# Patient Record
Sex: Male | Born: 1990 | Hispanic: Yes | Marital: Single | Smoking: Never smoker
Health system: Southern US, Community
[De-identification: ages and names within clinical notes are randomized; demographics above are authoritative.]

## PROBLEM LIST (undated history)

## (undated) DIAGNOSIS — J45909 Unspecified asthma, uncomplicated: Secondary | ICD-10-CM

## (undated) DIAGNOSIS — R011 Cardiac murmur, unspecified: Secondary | ICD-10-CM

## (undated) HISTORY — DX: Unspecified asthma, uncomplicated: J45.909

## (undated) HISTORY — DX: Cardiac murmur, unspecified: R01.1

## (undated) HISTORY — PX: NO PAST SURGERIES: SHX2092

---
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
## 2013-02-18 ENCOUNTER — Emergency Department: Payer: Self-pay | Admitting: Emergency Medicine

## 2013-02-18 LAB — CBC
HCT: 43.9 % (ref 40.0–52.0)
HGB: 14.2 g/dL (ref 13.0–18.0)
MCH: 26.4 pg (ref 26.0–34.0)
MCHC: 32.3 g/dL (ref 32.0–36.0)
MCV: 82 fL (ref 80–100)
Platelet: 171 10*3/uL (ref 150–440)
RBC: 5.37 10*6/uL (ref 4.40–5.90)
WBC: 5.2 10*3/uL (ref 3.8–10.6)

## 2013-02-18 LAB — COMPREHENSIVE METABOLIC PANEL
Anion Gap: 4 — ABNORMAL LOW (ref 7–16)
BUN: 8 mg/dL (ref 7–18)
Creatinine: 0.96 mg/dL (ref 0.60–1.30)
EGFR (African American): >60
Osmolality: 281 (ref 275–301)
Potassium: 4.1 mmol/L (ref 3.5–5.1)
SGPT (ALT): 41 U/L (ref 12–78)
Sodium: 142 mmol/L (ref 136–145)
Total Protein: 7.2 g/dL (ref 6.4–8.2)

## 2014-12-16 IMAGING — CR DG CHEST 2V
1 series · 2 of 2 positions shown · non-contrast
Comparison: none

REASON FOR EXAM: cp
COMMENTS:

PROCEDURE:     DXR - DXR CHEST PA (OR AP) AND LATERAL  - February 18, 2013  [DATE]
RESULT:     Comparison: 07/18/2005

[Series 1: w chest pa · 0.14mm/px · 2 of 2 slices shown]
[im 1/2]
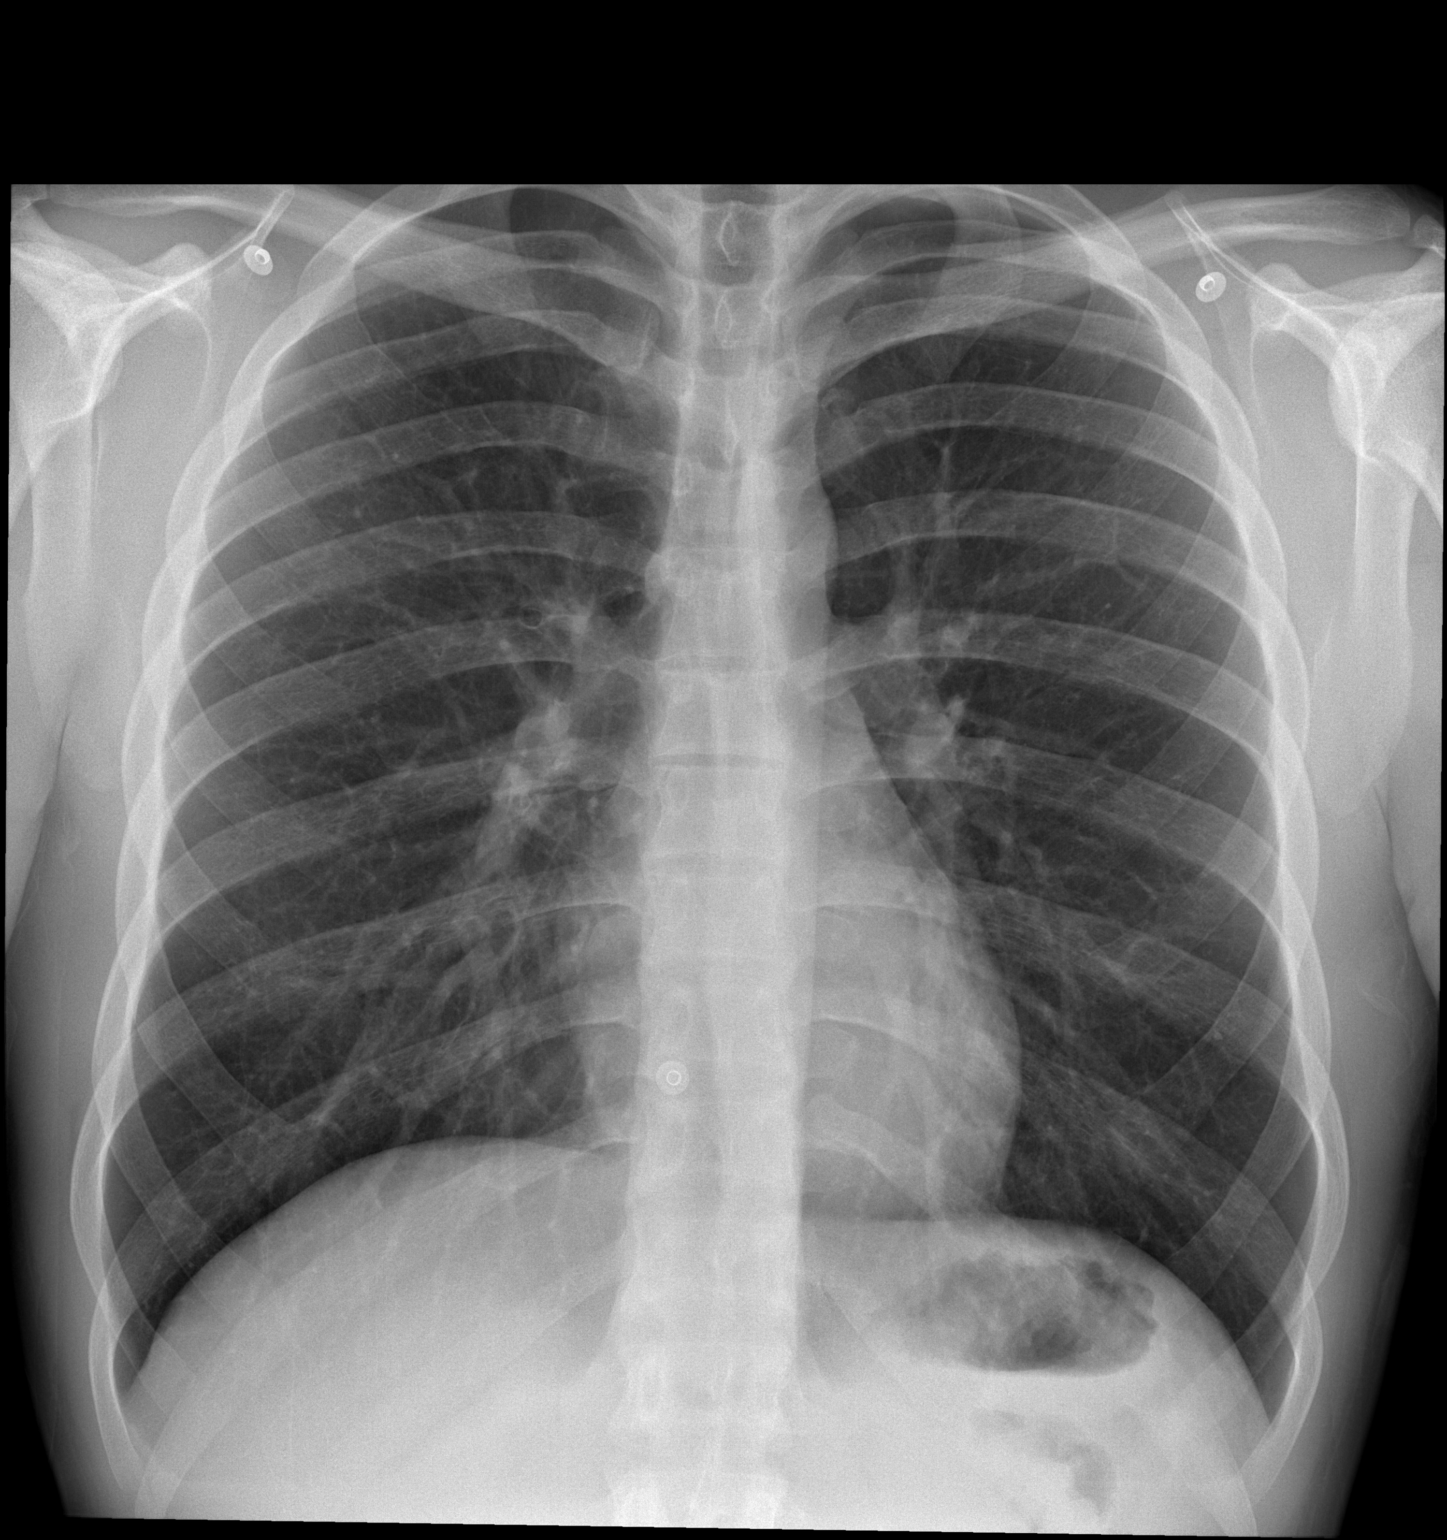
[im 2/2]
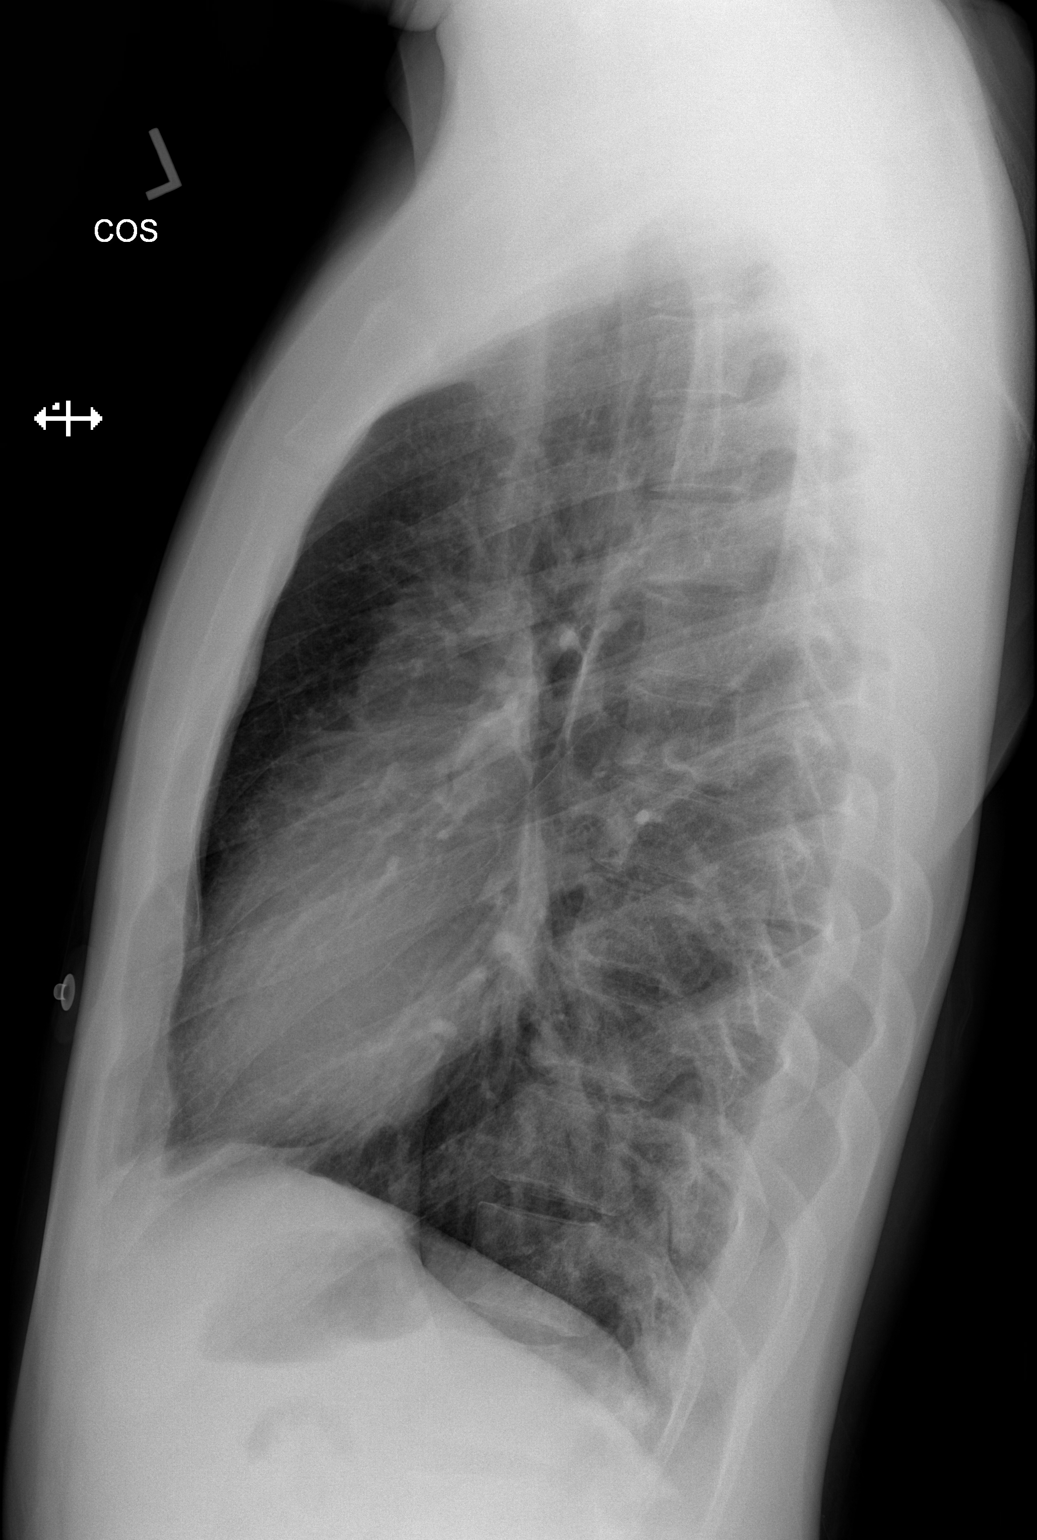

[2 of 2 positions shown; findings below may reference images not displayed]

FINDINGS: The heart and mediastinum are within normal limits. No focal pulmonary
opacities.
IMPRESSION: No acute cardiopulmonary disease.

[REDACTED]

## 2016-05-17 DIAGNOSIS — K219 Gastro-esophageal reflux disease without esophagitis: Secondary | ICD-10-CM | POA: Insufficient documentation

## 2016-08-20 DIAGNOSIS — R7303 Prediabetes: Secondary | ICD-10-CM | POA: Insufficient documentation

## 2018-09-15 ENCOUNTER — Encounter: Payer: Self-pay | Admitting: Family Medicine

## 2018-09-15 ENCOUNTER — Ambulatory Visit (INDEPENDENT_AMBULATORY_CARE_PROVIDER_SITE_OTHER): Payer: BLUE CROSS/BLUE SHIELD | Admitting: Family Medicine

## 2018-09-15 ENCOUNTER — Other Ambulatory Visit: Payer: Self-pay

## 2018-09-15 ENCOUNTER — Other Ambulatory Visit (HOSPITAL_COMMUNITY)
Admission: RE | Admit: 2018-09-15 | Discharge: 2018-09-15 | Disposition: A | Discharge status: Home / Self Care | Payer: BLUE CROSS/BLUE SHIELD | Source: Ambulatory Visit | Attending: Family Medicine | Admitting: Family Medicine

## 2018-09-15 VITALS — BP 110/88 | HR 62 | Temp 98.1°F | Resp 16 | Ht 67.0 in | Wt 146.7 lb

## 2018-09-15 DIAGNOSIS — J301 Allergic rhinitis due to pollen: Secondary | ICD-10-CM

## 2018-09-15 DIAGNOSIS — Z113 Encounter for screening for infections with a predominantly sexual mode of transmission: Secondary | ICD-10-CM | POA: Insufficient documentation

## 2018-09-15 DIAGNOSIS — Z1159 Encounter for screening for other viral diseases: Secondary | ICD-10-CM

## 2018-09-15 DIAGNOSIS — K219 Gastro-esophageal reflux disease without esophagitis: Secondary | ICD-10-CM

## 2018-09-15 DIAGNOSIS — G4700 Insomnia, unspecified: Secondary | ICD-10-CM

## 2018-09-15 DIAGNOSIS — Z0000 Encounter for general adult medical examination without abnormal findings: Secondary | ICD-10-CM | POA: Diagnosis not present

## 2018-09-15 DIAGNOSIS — E7800 Pure hypercholesterolemia, unspecified: Secondary | ICD-10-CM

## 2018-09-15 DIAGNOSIS — R7303 Prediabetes: Secondary | ICD-10-CM

## 2018-09-15 MED ORDER — LEVOCETIRIZINE DIHYDROCHLORIDE 5 MG PO TABS: 5.0000 mg | ORAL_TABLET | Freq: Every evening | ORAL | 1 refills | Status: DC

## 2018-09-15 MED ORDER — FAMOTIDINE 40 MG PO TABS: 40.0000 mg | ORAL_TABLET | Freq: Every day | ORAL | 1 refills | Status: DC

## 2018-09-15 NOTE — Patient Instructions (Addendum)
Try Melatonin - take 2.5-5mg  about 45 minutes before bed.   To help with reflux: - Elevate your head of bed - either with a few extra pillows, a wedge pillow, or by placing two bed risers under the head of bed posts. - Avoid the following foods: citrus, fatty foods, chocolate, peppermint, and excessive alcohol, along with sodas, orange juice (acidic drinks) - Stop eating at least 3 hours before going to bed, minimize naps/laying down after eating. - No smoking. - If you are overweight or obese, exercising and losing weight will also help your symptoms. - Caution: prolonged use of proton pump inhibitors like omeprazole (Prilosec), pantoprazole (Protonix), esomeprazole (Nexium), and others like Dexilant and Aciphex may increase your risk of pneumonia, Clostridium difficile colitis, osteoporosis, anemia and other health complications  Sleep Hygiene Tips 1) Get regular. One of the best ways to train your body to sleep well is to go to bed and get up at more or less the same time every day, even on weekends and days off! This regular rhythm will make you feel better and will give your body something to work from. 2) Sleep when sleepy. Only try to sleep when you actually feel tired or sleepy, rather than spending too much time awake in bed. 3) Get up & try again. If you haven't been able to get to sleep after about 20 minutes or more, get up and do something calming or boring until you feel sleepy, then return to bed and try again. Sit quietly on the couch with the lights off (bright light will tell your brain that it is time to wake up), or read something boring like the phone book. Avoid doing anything that is too stimulating or interesting, as this will wake you up even more. 4) Avoid caffeine & nicotine. It is best to avoid consuming any caffeine (in coffee, tea, cola drinks, chocolate, and some medications) or nicotine (cigarettes) for at least 4-6 hours before going to bed. These  substances act as stimulants and interfere with the ability to fall asleep 5) Avoid alcohol. It is also best to avoid alcohol for at least 4-6 hours before going to bed. Many people believe that alcohol is relaxing and helps them to get to sleep at first, but it actually interrupts the quality of sleep. 6) Bed is for sleeping. Try not to use your bed for anything other than sleeping and sex, so that your body comes to associate bed with sleep. If you use bed as a place to watch TV, eat, read, work on your laptop, pay bills, and other things, your body will not learn this Connection. 7) No naps. It is best to avoid taking naps during the day, to make sure that you are tired at bedtime. If you can't make it through the day without a nap, make sure it is for less than an hour and before 3pm. 8) Sleep rituals. You can develop your own rituals of things to remind your body that it is time to sleep - some people find it useful to do relaxing stretches or breathing exercises for 15 minutes before bed each night, or sit calmly with a cup of caffeine-free tea. 9) Bathtime. Having a hot bath 1-2 hours before bedtime can be useful, as it will raise your body temperature, causing you to feel sleepy as your body temperature drops again. Research shows that sleepiness is associated with a drop in body temperature. 10) No clock-watching. Many people who struggle with sleep tend  to watch the clock too much. Frequently checking the clock during the night can wake you up (especially if you turn on the light to read the time) and reinforces negative thoughts such as "Oh no, look how late it is, I'll never get to sleep" or "it's so early, I have only slept for 5 hours, this is terrible." 11) Use a sleep diary. This worksheet can be a useful way of making sure you have the right facts about your sleep, rather than making assumptions. Because a diary involves watching the clock (see point 10) it is  a good idea to only use it for two weeks to get an idea of what is going and then perhaps two months down the track to see how you are progressing. 12) Exercise. Regular exercise is a good idea to help with good sleep, but try not to do strenuous exercise in the 4 hours before bedtime. Morning walks are a great way to start the day feeling refreshed! 13) Eat right. A healthy, balanced diet will help you to sleep well, but timing is important. Some people find that a very empty stomach at bedtime is distracting, so it can be useful to have a light snack, but a heavy meal soon before bed can also interrupt sleep. Some people recommend a warm glass of milk, which contains tryptophan, which acts as a natural sleep inducer. 14) The right space. It is very important that your bed and bedroom are quiet and comfortable for sleeping. A cooler room with enough blankets to stay warm is best, and make sure you have curtains or an eyemask to block out early morning light and earplugs if there is noise outside your room. 15) Keep daytime routine the same. Even if you have a bad night sleep and are tired it is important that you try to keep your daytime activities the same as you had planned. That is, don't avoid activities because you feel tired. This can reinforce the insomnia.

## 2018-09-15 NOTE — Progress Notes (Signed)
Name: Mike Peters   MRN: [id number]    DOB: 1991/02/25   Date:09/15/2018       Progress Note  Subjective  Chief Complaint  Chief Complaint  Patient presents with  . Establish Care    HPI  Patient presents for annual CPE and to establish care.  USPSTF grade A and B recommendations:  Diet: Says he "eats everything".  Does not drink sodas, but does eat fried foods/fat food about 4 times a week. Exercise: Does not exercise.   Depression: He notes waking around 3-4am with some racing thoughts most days. He does note GERD - says it bothers him mostly at night and when he is hungry.  He was on protonix in the past but only for a week before stopping. We will trial pepcid today, and consider hydroxyzine or other sleep aid in the future. Denies chest pain, abdominal pain, no dysphagia. Depression screen [facility] 2/9 09/15/2018  Decreased Interest 0  Down, Depressed, Hopeless 0  PHQ - 2 Score 0  Altered sleeping 0  Tired, decreased energy 0  Change in appetite 0  Feeling bad or failure about yourself  0  Trouble concentrating 0  Moving slowly or fidgety/restless 0  Suicidal thoughts 0  PHQ-9 Score 0  Difficult doing work/chores Not difficult at all    Hypertension:  BP Readings from Last 3 Encounters:  09/15/18 110/88   Obesity: Wt Readings from Last 3 Encounters:  09/15/18 146 lb 11.2 oz (66.5 kg)   BMI Readings from Last 3 Encounters:  09/15/18 22.98 kg/m    Lipids: We will check today Glucose: We will check today Glucose  Date Value Ref Range Status  02/18/2013 89 65 - 99 mg/dL Final     [facility] Visit from 09/15/2018 in [facility]  AUDIT-C Score  0     Single STD testing and prevention (HIV/chl/gon/syphilis): 1 male partner in the last year; We will check today Hep C: We will check today  Skin cancer: No concerning lesions Colorectal cancer: Denies family or personal history of colorectal cancer, no changes in BM's - no blood in stool, dark and  tarry stool, mucus in stool, or constipation/diarrhea. Prostate cancer/Testicular cancer: Discussed self testicular examinations and signs and symptoms of prostate cancer.  No family history of prostate cancer.  No results found for: PSA  Lung cancer: N/A Low Dose CT Chest recommended if Age 74-80 years, 30 pack-year currently smoking OR have quit w/in 15years. Patient does not qualify.   AAA: N/A The USPSTF recommends one-time screening with ultrasonography in men ages 19 to 22 years who have ever smoked ECG:  History of heart murmur as child; no chest pain, shortness of breath, palpitations.   Advanced Care Planning: A voluntary discussion about advance care planning including the explanation and discussion of advance directives.  Discussed health care proxy and Living will, and the patient was able to identify a health care proxy as Kerri Bhatti.  Patient does not have a living will at present time. If patient does have living will, I have requested they bring this to the clinic to be scanned in to their chart.  Patient Active Problem List   Diagnosis Date Noted  . Prediabetes 08/20/2016  . GERD without esophagitis 05/17/2016    Past Surgical History:  Procedure Laterality Date  . NO PAST SURGERIES      Family History  Problem Relation Age of Onset  . Diabetes Mother        prediabetes  Social History   Socioeconomic History  . Marital status: Single    Spouse name: Not on file  . Number of children: Not on file  . Years of education: Not on file  . Highest education level: Not on file  Occupational History  . Not on file  Social Needs  . Financial resource strain: Not hard at all  . Food insecurity:    Worry: Never true    Inability: Never true  . Transportation needs:    Medical: No    Non-medical: No  Tobacco Use  . Smoking status: Never Smoker  . Smokeless tobacco: Never Used  Substance and Sexual Activity  . Alcohol use: Never    Frequency: Never  . Drug  use: Never  . Sexual activity: Yes    Partners: Female    Birth control/protection: Condom  Lifestyle  . Physical activity:    Days per week: 0 days    Minutes per session: 0 min  . Stress: Not at all  Relationships  . Social connections:    Talks on phone: More than three times a week    Gets together: More than three times a week    Attends religious service: 1 to 4 times per year    Active member of club or organization: No    Attends meetings of clubs or organizations: Never    Relationship status: Never married  . Intimate partner violence:    Fear of current or ex partner: No    Emotionally abused: No    Physically abused: No    Forced sexual activity: No  Other Topics Concern  . 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  levocetirizine (XYZAL) 5 MG tablet, Take 5 mg by mouth every evening., Disp: , Rfl:   No Known Allergies   ROS  Constitutional: Negative for fever or weight change.  Respiratory: Negative for cough and shortness of breath.   Cardiovascular: Negative for chest pain or palpitations.  Gastrointestinal: Negative for abdominal pain, no bowel changes.  Musculoskeletal: Negative for gait problem or joint swelling.  Skin: Negative for rash.  Neurological: Negative for dizziness or headache.  No other specific complaints in a complete review of systems (except as listed in HPI above).   Objective  Vitals:   09/15/18 0811  BP: 110/88  Pulse: 62  Resp: 16  Temp: 98.1 F (36.7 C)  TempSrc: Oral  SpO2: 93%  Weight: 146 lb 11.2 oz (66.5 kg)  Height: 5\' 7"  (1.702 m)    Body mass index is 22.98 kg/m.  Physical Exam Constitutional: Patient appears well-developed and well-nourished. No distress.  HENT: Head: Normocephalic and atraumatic. Ears: B TMs ok, no erythema or effusion; Nose: Nose normal. Mouth/Throat: Oropharynx is clear and moist. No  oropharyngeal exudate.  Eyes: Conjunctivae and EOM are normal. Pupils are equal, round, and reactive to light. No scleral icterus.  Neck: Normal range of motion. Neck supple. No JVD present. No thyromegaly present.  Cardiovascular: Normal rate, regular rhythm and normal heart sounds.  No murmur heard. No BLE edema. Pulmonary/Chest: Effort normal and breath sounds normal. No respiratory distress. Abdominal: Soft. Bowel sounds are normal, no distension. There is no tenderness. no masses MALE GENITALIA: Deferred RECTAL: Deferred Musculoskeletal: Normal range of motion, no joint effusions. No gross deformities Neurological: he is alert and oriented to person, place, and time.  No cranial nerve deficit. Coordination, balance, strength, speech and gait are normal.  Skin: Skin is warm and dry. No rash noted. No erythema.  Psychiatric: Patient has a normal mood and affect. behavior is normal. Judgment and thought content normal.  No results found for this or any previous visit (from the past 2160 hour(s)).  PHQ2/9: [facility] 09/15/2018  Decreased Interest 0  Down, Depressed, Hopeless 0  PHQ - 2 Score 0  Altered sleeping 0  Tired, decreased energy 0  Change in appetite 0  Feeling bad or failure about yourself  0  Trouble concentrating 0  Moving slowly or fidgety/restless 0  Suicidal thoughts 0  PHQ-9 Score 0  Difficult doing work/chores Not difficult at all   Fall Risk: Fall Risk  09/15/2018  Falls in the past year? 0  Number falls in past yr: 0  Injury with Fall? 0  Follow up Falls evaluation completed   Assessment & Plan  1. Annual physical exam - Prostate cancer screening and PSA options (with potential risks and benefits of testing vs not testing) were discussed along with recent recs/guidelines. -USPSTF grade A and B recommendations reviewed with patient; age-appropriate recommendations, preventive care, screening tests, etc discussed and encouraged; healthy living  encouraged; see AVS for patient education given to patient -Discussed importance of 150 minutes of physical activity weekly, eat two servings of fish weekly, eat one serving of tree nuts ( cashews, pistachios, pecans, almonds.Marland Kitchen.) every other day, eat 6 servings of fruit/vegetables daily and drink plenty of water and avoid sweet beverages.   2. Seasonal allergic rhinitis due to pollen - levocetirizine (XYZAL) 5 MG tablet; Take 1 tablet (5 mg total) by mouth every evening.  Dispense: 90 tablet; Refill: 1  3. Elevated LDL cholesterol level - Lipid panel  4. Insomnia, unspecified type - Melatonin  5. Prediabetes - Hemoglobin A1c - COMPLETE METABOLIC PANEL WITH GFR  6. GERD without esophagitis - famotidine (PEPCID) 40 MG tablet; Take 1 tablet (40 mg total) by mouth at bedtime.  Dispense: 90 tablet; Refill: 1  7. Need for hepatitis C screening test - Hepatitis C antibody  8. Routine screening for STI (sexually transmitted infection) - Hepatitis C antibody - HIV Antibody (routine testing w rflx) - RPR - Urine cytology ancillary only

## 2018-09-16 ENCOUNTER — Telehealth: Payer: Self-pay | Admitting: Family Medicine

## 2018-09-16 LAB — HEMOGLOBIN A1C
EAG (MMOL/L): 6.2 (calc)
Hgb A1c MFr Bld: 5.5 % of total Hgb (ref <5.7)
Mean Plasma Glucose: 111 (calc)

## 2018-09-16 LAB — LIPID PANEL
Cholesterol: 202 mg/dL — ABNORMAL HIGH (ref <200)
HDL: 69 mg/dL (ref >40)
LDL Cholesterol (Calc): 117 mg/dL (calc) — ABNORMAL HIGH
Non-HDL Cholesterol (Calc): 133 mg/dL (calc) — ABNORMAL HIGH (ref <130)
Total CHOL/HDL Ratio: 2.9 (calc) (ref <5.0)
Triglycerides: 67 mg/dL (ref <150)

## 2018-09-16 LAB — COMPLETE METABOLIC PANEL WITH GFR
AG Ratio: 1.9 (calc) (ref 1.0–2.5)
ALT: 24 U/L (ref 9–46)
AST: 24 U/L (ref 10–40)
Albumin: 4.9 g/dL (ref 3.6–5.1)
Alkaline phosphatase (APISO): 73 U/L (ref 40–115)
BUN: 11 mg/dL (ref 7–25)
CO2: 29 mmol/L (ref 20–32)
Calcium: 10.4 mg/dL — ABNORMAL HIGH (ref 8.6–10.3)
Chloride: 104 mmol/L (ref 98–110)
Creat: 0.98 mg/dL (ref 0.60–1.35)
GFR, Est African American: 122 mL/min/{1.73_m2} (ref >=60)
GFR, Est Non African American: 105 mL/min/{1.73_m2} (ref >=60)
GLUCOSE: 90 mg/dL (ref 65–99)
Globulin: 2.6 g/dL (calc) (ref 1.9–3.7)
Potassium: 4.7 mmol/L (ref 3.5–5.3)
Sodium: 143 mmol/L (ref 135–146)
Total Bilirubin: 0.7 mg/dL (ref 0.2–1.2)
Total Protein: 7.5 g/dL (ref 6.1–8.1)

## 2018-09-16 LAB — RPR: RPR Ser Ql: NONREACTIVE

## 2018-09-16 LAB — URINE CYTOLOGY ANCILLARY ONLY
CHLAMYDIA, DNA PROBE: NEGATIVE
Neisseria Gonorrhea: NEGATIVE

## 2018-09-16 LAB — HEPATITIS C ANTIBODY
Hepatitis C Ab: NONREACTIVE
SIGNAL TO CUT-OFF: 0.02 (ref <1.00)

## 2018-09-16 LAB — HIV ANTIBODY (ROUTINE TESTING W REFLEX): HIV 1&2 Ab, 4th Generation: NONREACTIVE

## 2018-09-16 NOTE — Telephone Encounter (Signed)
Copied from CRM [id number]. Topic: Quick Communication - Rx Refill/Question >> Sep 16, 2018  1:56 PM Maia Petties wrote: Medication: famotidine (PEPCID) 40 MG tablet - pt states pharmacy was out of stock so he bought OTC 10mg  famotidine - he is asking for instruction on how to take/advice. Please call back. Ok to leave detailed msg per pt.

## 2018-09-17 MED ORDER — FAMOTIDINE 20 MG PO TABS: 40.0000 mg | ORAL_TABLET | Freq: Every day | ORAL | 1 refills | Status: DC

## 2018-09-17 NOTE — Telephone Encounter (Addendum)
error 

## 2018-09-17 NOTE — Telephone Encounter (Signed)
Do you want to call in the 20mg  and see if insurance will cover

## 2018-09-17 NOTE — Telephone Encounter (Signed)
Per Dr.Sowles may take 4 tablets at once. Patient notified

## 2018-09-17 NOTE — Addendum Note (Signed)
Addended by: Doren Custard on: 09/17/2018 07:44 PM   Modules accepted: Orders

## 2018-09-17 NOTE — Telephone Encounter (Signed)
Yes - please call in 20mg  tablets. Thanks.

## 2018-09-18 NOTE — Telephone Encounter (Signed)
Script sent to pharmacy by [facility]

## 2018-09-29 ENCOUNTER — Encounter: Payer: Self-pay | Admitting: Family Medicine

## 2018-09-29 ENCOUNTER — Ambulatory Visit (INDEPENDENT_AMBULATORY_CARE_PROVIDER_SITE_OTHER): Payer: BLUE CROSS/BLUE SHIELD | Admitting: Family Medicine

## 2018-09-29 VITALS — BP 120/72 | HR 105 | Temp 99.6°F | Resp 14 | Ht 67.0 in | Wt 146.2 lb

## 2018-09-29 DIAGNOSIS — J111 Influenza due to unidentified influenza virus with other respiratory manifestations: Secondary | ICD-10-CM

## 2018-09-29 DIAGNOSIS — R69 Illness, unspecified: Secondary | ICD-10-CM

## 2018-09-29 MED ORDER — ALBUTEROL SULFATE HFA 108 (90 BASE) MCG/ACT IN AERS: 2.0000 | INHALATION_SPRAY | Freq: Four times a day (QID) | RESPIRATORY_TRACT | 0 refills | Status: DC | PRN

## 2018-09-29 MED ORDER — PROMETHAZINE-DM 6.25-15 MG/5ML PO SYRP: 5.0000 mL | ORAL_SOLUTION | Freq: Four times a day (QID) | ORAL | 0 refills | Status: DC | PRN

## 2018-09-29 NOTE — Progress Notes (Signed)
Name: Mike Peters MRN: [id number] DOB: 1990/10/10 Date:09/29/2018 Progress Note Subjective Chief Complaint Chief Complaint  Patient presents with  . FLU like symptoms   HPI Patient presents to the clinic with sore throat, cough with mucous production, and fever since Friday night. Associated symptoms includes chills, malaise, and fatigue. Denies chest pain, rhinorrhea, sneezing, wheezing, or shortness of breath. Pt symptoms have not worsened, however they have not improved. Patient has been taking ibuprofen for the fever with mild improvement, however symptoms reappear afterwards. Additionally, pt has tried muccinex for cough with some relief. Most significant complaint includes cough and fever. Patient Active Problem List   Diagnosis Date Noted  . Prediabetes 08/20/2016  . GERD without esophagitis 05/17/2016   Past Medical History:  Diagnosis Date  . Murmur    Past Surgical History:  Procedure Laterality Date  . NO PAST SURGERIES     Social History   Tobacco Use  . Smoking status: Never Smoker  . Smokeless tobacco: Never Used  Substance Use Topics  . Alcohol use: Never    Frequency: Never    Current Outpatient Medications:  .  famotidine (PEPCID) 20 MG tablet, Take 2 tablets (40 mg total) by mouth at bedtime., Disp: 180 tablet, Rfl: 1 .  levocetirizine (XYZAL) 5 MG tablet, Take 1 tablet (5 mg total) by mouth every evening., Disp: 90 tablet, Rfl: 1 No Known Allergies Review of Systems  Constitutional: Positive for chills, fever and malaise/fatigue.  HENT: Positive for congestion and sore throat. Negative for ear pain, hearing loss and tinnitus.   Respiratory: Positive for cough and sputum production.   Cardiovascular: Negative for chest pain.  Gastrointestinal: Negative for nausea and vomiting.   No other specific complaints in a complete review of systems (except as listed in HPI above). Objective Vitals:   09/29/18 1351  BP: 120/72  Pulse: (!) 105  Resp: 14  Temp:  99.6 F (37.6 C)  TempSrc: Oral  SpO2: 98%  Weight: 146 lb 3.2 oz (66.3 kg)  Height: 5\' 7"  (1.702 m)    Body mass index is 22.9 kg/m. Nursing Note and Vital Signs reviewed. Physical Exam Constitutional:      Appearance: Normal appearance.  HENT:     Head: Normocephalic.     Right Ear: Tympanic membrane normal.     Left Ear: Tympanic membrane normal.     Nose: Congestion present. No rhinorrhea.     Right Sinus: No maxillary sinus tenderness or frontal sinus tenderness.     Left Sinus: No maxillary sinus tenderness or frontal sinus tenderness.     Mouth/Throat:     Mouth: Mucous membranes are moist.     Pharynx: Uvula midline. No pharyngeal swelling or posterior oropharyngeal erythema.  Cardiovascular:     Rate and Rhythm: Normal rate and regular rhythm.     Pulses: Normal pulses.  Pulmonary:     Effort: Pulmonary effort is normal.     Breath sounds: Normal breath sounds.  Lymphadenopathy:     Head:     Right side of head: No submental, submandibular, tonsillar, preauricular, posterior auricular or occipital adenopathy.     Left side of head: No submental, submandibular, tonsillar, preauricular, posterior auricular or occipital adenopathy.     Cervical: No cervical adenopathy.  Neurological:     General: No focal deficit present.     Mental Status: He is alert and oriented to person, place, and time.     ? No results found for this or any previous visit (from  the past 48 hour(s)). Assessment & Plan 1. Influenza-like illness - promethazine-dextromethorphan (PROMETHAZINE-DM) 6.25-15 MG/5ML syrup; Take 5 mLs by mouth 4 (four) times daily as needed for cough.  Dispense: 118 mL; Refill: 0 - albuterol (PROVENTIL HFA;VENTOLIN HFA) 108 (90 Base) MCG/ACT inhaler; Inhale 2 puffs into the lungs every 6 (six) hours as needed for wheezing or shortness of breath.  Dispense: 1 Inhaler; Refill: 0 - Take medications as prescribed for cough. If shortness of breath or wheezing develops,  take prescribed albuterol medication. Alternate between ibuprofen and acetaminophen for fever relief. Ensure adequate hydration and rest. If symptoms do not resolve in 7-10 days or significantly worsen, please follow up at the clinic.  ? -Red flags and when to present for emergency care or RTC including fever >101.34F, chest pain, shortness of breath, new/worsening/un-resolving symptoms, reviewed with patient at time of visit. Follow up and care instructions discussed and provided in AVS.

## 2018-09-29 NOTE — Patient Instructions (Signed)
Alternate 1000mg  Tylenol and 600mg  Ibuprofen every 6 hours for fever/chills/pain. Take Cough medication as prescribed. Cool Mist Vaporizer A cool mist vaporizer is a device that releases a cool mist into the air. If you have a cough or a cold, using a vaporizer may help relieve your symptoms. The mist adds moisture to the air, which may help thin your mucus and make it less sticky. When your mucus is thin and less sticky, it easier for you to breathe and to cough up secretions. Do not use a vaporizer if you are allergic to mold. Follow these instructions at home:  Follow the instructions that come with the vaporizer.  Do not use anything other than distilled water in the vaporizer.  Do not run the vaporizer all of the time. Doing that can cause mold or bacteria to grow in the vaporizer.  Clean the vaporizer after each time that you use it.  Clean and dry the vaporizer well before storing it.  Stop using the vaporizer if your breathing symptoms get worse. This information is not intended to replace advice given to you by your health care provider. Make sure you discuss any questions you have with your health care provider. Document Released: 05/02/2004 Document Revised: 02/23/2016 Document Reviewed: 11/04/2015 Elsevier Interactive Patient Education  2019 [employer]   Influenza, Adult Influenza is also called "the flu." It is an infection in the lungs, nose, and throat (respiratory tract). It is caused by a virus. The flu causes symptoms that are similar to symptoms of a cold. It also causes a high fever and body aches. The flu spreads easily from person to person (is contagious). Getting a flu shot (influenza vaccination) every year is the best way to prevent the flu. What are the causes? This condition is caused by the influenza virus. You can get the virus by:  Breathing in droplets that are in the air from the cough or sneeze of a person who has the virus.  Touching something  that has the virus on it (is contaminated) and then touching your mouth, nose, or eyes. What increases the risk? Certain things may make you more likely to get the flu. These include:  Not washing your hands often.  Having close contact with many people during cold and flu season.  Touching your mouth, eyes, or nose without first washing your hands.  Not getting a flu shot every year. You may have a higher risk for the flu, along with serious problems such as a lung infection (pneumonia), if you:  Are older than 65.  Are pregnant.  Have a weakened disease-fighting system (immune system) because of a disease or taking certain medicines.  Have a long-term (chronic) illness, such as: ? Heart, kidney, or lung disease. ? Diabetes. ? Asthma.  Have a liver disorder.  Are very overweight (morbidly obese).  Have anemia. This is a condition that affects your red blood cells. What are the signs or symptoms? Symptoms usually begin suddenly and last 4-14 days. They may include:  Fever and chills.  Headaches, body aches, or muscle aches.  Sore throat.  Cough.  Runny or stuffy (congested) nose.  Chest discomfort.  Not wanting to eat as much as normal (poor appetite).  Weakness or feeling tired (fatigue).  Dizziness.  Feeling sick to your stomach (nauseous) or throwing up (vomiting). How is this treated? If the flu is found early, you can be treated with medicine that can help reduce how bad the illness is and how long  it lasts (antiviral medicine). This may be given by mouth (orally) or through an IV tube. Taking care of yourself at home can help your symptoms get better. Your doctor may suggest:  Taking over-the-counter medicines.  Drinking plenty of fluids. The flu often goes away on its own. If you have very bad symptoms or other problems, you may be treated in a hospital. Follow these instructions at home:     Activity  Rest as needed. Get plenty of  sleep.  Stay home from work or school as told by your doctor. ? Do not leave home until you do not have a fever for 24 hours without taking medicine. ? Leave home only to visit your doctor. Eating and drinking  Take an ORS (oral rehydration solution). This is a drink that is sold at pharmacies and stores.  Drink enough fluid to keep your pee (urine) pale yellow.  Drink clear fluids in small amounts as you are able. Clear fluids include: ? Water. ? Ice chips. ? Fruit juice that has water added (diluted fruit juice). ? Low-calorie sports drinks.  Eat bland, easy-to-digest foods in small amounts as you are able. These foods include: ? Bananas. ? Applesauce. ? Rice. ? Lean meats. ? Toast. ? Crackers.  Do not eat or drink: ? Fluids that have a lot of sugar or caffeine. ? Alcohol. ? Spicy or fatty foods. General instructions  Take over-the-counter and prescription medicines only as told by your doctor.  Use a cool mist humidifier to add moisture to the air in your home. This can make it easier for you to breathe.  Cover your mouth and nose when you cough or sneeze.  Wash your hands with soap and water often, especially after you cough or sneeze. If you cannot use soap and water, use alcohol-based hand sanitizer.  Keep all follow-up visits as told by your doctor. This is important. How is this prevented?   Get a flu shot every year. You may get the flu shot in late summer, fall, or winter. Ask your doctor when you should get your flu shot.  Avoid contact with people who are sick during fall and winter (cold and flu season). Contact a doctor if:  You get new symptoms.  You have: ? Chest pain. ? Watery poop (diarrhea). ? A fever.  Your cough gets worse.  You start to have more mucus.  You feel sick to your stomach.  You throw up. Get help right away if you:  Have shortness of breath.  Have trouble breathing.  Have skin or nails that turn a bluish  color.  Have very bad pain or stiffness in your neck.  Get a sudden headache.  Get sudden pain in your face or ear.  Cannot eat or drink without throwing up. Summary  Influenza ("the flu") is an infection in the lungs, nose, and throat. It is caused by a virus.  Take over-the-counter and prescription medicines only as told by your doctor.  Getting a flu shot every year is the best way to avoid getting the flu. This information is not intended to replace advice given to you by your health care provider. Make sure you discuss any questions you have with your health care provider. Document Released: 05/14/2008 Document Revised: 01/21/2018 Document Reviewed: 01/21/2018 Elsevier Interactive Patient Education  2019 [employer].

## 2018-09-29 NOTE — Progress Notes (Deleted)
Acute Office Visit  Subjective:    Patient ID: Mike Peters, male    DOB: [birth year], 28 y.o.   MRN: [id number]  Chief Complaint  Patient presents with  . FLU like symptoms    Influenza  This is a new problem. The current episode started in the past 7 days. The problem occurs constantly. The problem has been gradually worsening. Associated symptoms include chills, congestion, coughing, fatigue, a fever and a sore throat. Pertinent negatives include no chest pain. Nothing aggravates the symptoms. He has tried acetaminophen and rest (mucinex) for the symptoms. The treatment provided no relief.   Past Medical History:  Diagnosis Date  . Murmur     Past Surgical History:  Procedure Laterality Date  . NO PAST SURGERIES      Family History  Problem Relation Age of Onset  . Diabetes Mother        prediabetes    Social History   Socioeconomic History  . Marital status: Single    Spouse name: Not on file  . Number of children: Not on file  . Years of education: Not on file  . Highest education level: Not on file  Occupational History  . Not on file  Social Needs  . Financial resource strain: Not hard at all  . Food insecurity:    Worry: Never true    Inability: Never true  . Transportation needs:    Medical: No    Non-medical: No  Tobacco Use  . Smoking status: Never Smoker  . Smokeless tobacco: Never Used  Substance and Sexual Activity  . Alcohol use: Never    Frequency: Never  . Drug use: Never  . Sexual activity: Yes    Partners: Female    Birth control/protection: Condom  Lifestyle  . Physical activity:    Days per week: 0 days    Minutes per session: 0 min  . Stress: Not at all  Relationships  . Social connections:    Talks on phone: More than three times a week    Gets together: More than three times a week    Attends religious service: 1 to 4 times per year    Active member of club or organization: No    Attends meetings of clubs or organizations:  Never    Relationship status: Never married  . Intimate partner violence:    Fear of current or ex partner: No    Emotionally abused: No    Physically abused: No    Forced sexual activity: No  Other Topics Concern  . Not on file  Social History Narrative   Lives with mom and brother - also has a Orthoptist and chihuahua     Outpatient Medications Prior to Visit  Medication Sig Dispense Refill  . famotidine (PEPCID) 20 MG tablet Take 2 tablets (40 mg total) by mouth at bedtime. 180 tablet 1  . levocetirizine (XYZAL) 5 MG tablet Take 1 tablet (5 mg total) by mouth every evening. 90 tablet 1   No facility-administered medications prior to visit.     No Known Allergies  Review of Systems  Constitutional: Positive for chills, fatigue and fever.  HENT: Positive for congestion and sore throat.   Respiratory: Positive for cough.   Cardiovascular: Negative for chest pain.       Objective:    Physical Exam  Constitutional: He is oriented to person, place, and time. He appears well-developed and well-nourished. No distress.  HENT:  Head: Normocephalic and atraumatic.  Right Ear: Tympanic membrane, external ear and ear canal normal.  Left Ear: Tympanic membrane, external ear and ear canal normal.  Nose: Nose normal. No mucosal edema or rhinorrhea. Right sinus exhibits no maxillary sinus tenderness and no frontal sinus tenderness. Left sinus exhibits no maxillary sinus tenderness and no frontal sinus tenderness.  Mouth/Throat: Uvula is midline, oropharynx is clear and moist and mucous membranes are normal. No oropharyngeal exudate, posterior oropharyngeal edema, posterior oropharyngeal erythema or tonsillar abscesses.  Eyes: Pupils are equal, round, and reactive to light. Conjunctivae and EOM are normal. No scleral icterus.  Neck: Normal range of motion and full passive range of motion without pain. Neck supple. Normal range of motion present.  Cardiovascular: Normal rate, regular rhythm  and normal heart sounds. Exam reveals no friction rub.  No murmur heard. Pulmonary/Chest: Effort normal and breath sounds normal. No respiratory distress. He has no wheezes. He has no rales.  Musculoskeletal: Normal range of motion.        General: No deformity or edema.  Lymphadenopathy:    He has no cervical adenopathy.  Neurological: He is alert and oriented to person, place, and time. No cranial nerve deficit.  Skin: Skin is warm and dry. No rash noted. He is not diaphoretic.  Psychiatric: He has a normal mood and affect. His behavior is normal. Judgment and thought content normal.  Nursing note and vitals reviewed.   BP 120/72 (BP Location: Right Arm, Patient Position: Sitting, Cuff Size: Large)   Pulse (!) 105   Temp 99.6 F (37.6 C) (Oral)   Resp 14   Ht 5\' 7"  (1.702 m)   Wt 146 lb 3.2 oz (66.3 kg)   SpO2 98%   BMI 22.90 kg/m  Wt Readings from Last 3 Encounters:  09/29/18 146 lb 3.2 oz (66.3 kg)  09/15/18 146 lb 11.2 oz (66.5 kg)    Health Maintenance Due  Topic Date Due  . TETANUS/TDAP  10/25/2009  . INFLUENZA VACCINE  03/19/2018   There are no preventive care reminders to display for this patient.   No results found for: TSH Lab Results  Component Value Date   WBC 5.2 02/18/2013   HGB 14.2 02/18/2013   HCT 43.9 02/18/2013   MCV 82 02/18/2013   PLT 171 02/18/2013   Lab Results  Component Value Date   NA 143 09/15/2018   K 4.7 09/15/2018   CO2 29 09/15/2018   GLUCOSE 90 09/15/2018   BUN 11 09/15/2018   CREATININE 0.98 09/15/2018   BILITOT 0.7 09/15/2018   ALKPHOS 100 02/18/2013   AST 24 09/15/2018   ALT 24 09/15/2018   PROT 7.5 09/15/2018   ALBUMIN 4.0 02/18/2013   CALCIUM 10.4 (H) 09/15/2018   ANIONGAP 4 (L) 02/18/2013   Lab Results  Component Value Date   CHOL 202 (H) 09/15/2018   Lab Results  Component Value Date   HDL 69 09/15/2018   Lab Results  Component Value Date   LDLCALC 117 (H) 09/15/2018   Lab Results  Component Value Date     TRIG 67 09/15/2018   Lab Results  Component Value Date   CHOLHDL 2.9 09/15/2018   Lab Results  Component Value Date   HGBA1C 5.5 09/15/2018      Assessment & Plan:   Problem List Items Addressed This Visit    None    Visit Diagnoses    Influenza-like illness    -  Primary   Relevant Medications   promethazine-dextromethorphan (PROMETHAZINE-DM) 6.25-15 MG/5ML syrup  Meds ordered this encounter  Medications  . promethazine-dextromethorphan (PROMETHAZINE-DM) 6.25-15 MG/5ML syrup    Sig: Take 5 mLs by mouth 4 (four) times daily as needed for cough.    Dispense:  118 mL    Refill:  0    Order Specific Question:   Supervising Provider    Answer:   Alba CorySOWLES, KRICHNA [[id number]]   Doren CustardEmily E Boyce, FNP

## 2018-10-02 ENCOUNTER — Ambulatory Visit: Payer: Self-pay | Admitting: *Deleted

## 2018-10-02 DIAGNOSIS — R05 Cough: Secondary | ICD-10-CM

## 2018-10-02 DIAGNOSIS — R059 Cough, unspecified: Secondary | ICD-10-CM

## 2018-10-02 MED ORDER — GUAIFENESIN-CODEINE 100-10 MG/5ML PO SOLN: 5.0000 mL | Freq: Four times a day (QID) | ORAL | 0 refills | Status: AC | PRN

## 2018-10-02 NOTE — Telephone Encounter (Signed)
Pt called stating that everything has gotten better since he was seen in the office on 09/29/2018; however the cough syrup has not helped; he states that he has used his inhaler and cool mist vaporizer; the pt would like to have a prescription for codeine- guaifenesin cough syrup called in to [facility]; recommendations made per nurse triage protocol; the pt can be contacted at [phone] and a message can be left; will route to office for final disposition.  Reason for Disposition . Cough  Answer Assessment - Initial Assessment Questions 1. ONSET: "When did the cough begin?"      Seen in office 09/29/2018 2. SEVERITY: "How bad is the cough today?"      Comes in spurts; sometimes it is non-stop; "moderate" 3. RESPIRATORY DISTRESS: "Describe your breathing."      breathing ok, no shortness of breath 4. FEVER: "Do you have a fever?" If so, ask: "What is your temperature, how was it measured, and when did it start?"     no 5. SPUTUM: "Describe the color of your sputum" (clear, white, yellow, green)     yellow 6. HEMOPTYSIS: "Are you coughing up any blood?" If so ask: "How much?" (flecks, streaks, tablespoons, etc.)     no 7. CARDIAC HISTORY: "Do you have any history of heart disease?" (e.g., heart attack, congestive heart failure)      no 8. LUNG HISTORY: "Do you have any history of lung disease?"  (e.g., pulmonary embolus, asthma, emphysema)     no 9. PE RISK FACTORS: "Do you have a history of blood clots?" (or: recent major surgery, recent prolonged travel, bedridden)     no 10. OTHER SYMPTOMS: "Do you have any other symptoms?" (e.g., runny nose, wheezing, chest pain)       Nasal congestion in left nare  11. PREGNANCY: "Is there any chance you are pregnant?" "When was your last menstrual period?"       n/a 12. TRAVEL: "Have you traveled out of the country in the last month?" (e.g., travel history, exposures)       no  Protocols used: COUGH - ACUTE PRODUCTIVE-A-AH

## 2018-10-02 NOTE — Addendum Note (Signed)
Addended by: Doren Custard on: 10/02/2018 12:04 PM   Modules accepted: Orders

## 2019-11-09 NOTE — Progress Notes (Signed)
Name: Mike Peters   MRN: [id number]    DOB: February 01, 1991   Date:11/10/2019       Progress Note  Subjective  Chief Complaint  Chief Complaint  Patient presents with  . Annual Exam    HPI  Patient presents for annual CPE   GERD -noted he originally made an appointment to be seen for this, and then it moved to an annual physical.  It has been more constant over the past months, says it bothers him mostly at night and often when he is hungry.  He tried pepcid per Mike Peters rec prior visit, may have helped a little, worse when came off pepcid, like a constant hunger pain, and then after a week and a half was better again, was on protonix in the past but only used for a week before stopping. Sleeps on a pillow wedge to help, and without the wedge, his symptoms are worse.  Has tried to avoid spicy foods, no pops,avoids lime, tries to avoid fast food but does eat about 2 times a week for lunch. Symptoms usually worse at 0500 when occur, often gets up and eats something like a banana, and then often can get back to sleep. He notes symptoms are not as bad now, and intermittently worse, not losing weight Denies chest pain, abdominal pain, no dysphagia. Just "burning" in stomach, can climb up his chest when lays down, No N/V Last year tried probiotics, no real difference No nausea/vomiting, although did note when it was read he had that feeling up into his mouth which made him want to vomit, no dark or black stools, no blood in the stools. Not taking NSAIDs.  Hyperlipidemia - last lipids Lab Results  Component Value Date   CHOL 202 (H) 09/15/2018   HDL 69 09/15/2018   LDLCALC 117 (H) 09/15/2018   TRIG 67 09/15/2018   CHOLHDL 2.9 09/15/2018   Also notes a lack of focus in recent past.  At times some difficulty concentrating.  No ADD/ADHD history.  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   USPSTF grade A and B recommendations:  Diet:  No marked dietary limitations, does  not drink sodas, avoid spicy food but does eat fried foods/fast food about 2 times a week - lunch. 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   Depression: phq 9 is neg Depression screen [facility] 2/9 11/10/2019 09/29/2018 09/15/2018  Decreased Interest 0 0 0  Down, Depressed, Hopeless 0 0 0  PHQ - 2 Score 0 0 0  Altered sleeping 0 0 0  Tired, decreased energy 0 0 0  Change in appetite 0 0 0  Feeling bad or failure about yourself  0 0 0  Trouble concentrating 1 0 0  Moving slowly or fidgety/restless 0 0 0  Suicidal thoughts 0 0 0  PHQ-9 Score 1 0 0  Difficult doing work/chores Not difficult at all Not difficult at all Not difficult at all    Hypertension:  BP Readings from Last 3 Encounters:  11/10/19 120/70  09/29/18 120/72  09/15/18 110/88    Obesity: Wt Readings from Last 3 Encounters:  11/10/19 149 lb 11.2 oz (67.9 kg)  09/29/18 146 lb 3.2 oz (66.3 kg)  09/15/18 146 lb 11.2 oz (66.5 kg)   BMI Readings from Last 3 Encounters:  11/10/19 23.45 kg/m  09/29/18 22.90 kg/m  09/15/18 22.98 kg/m     Lipids:  Lab Results  Component Value Date   CHOL 202 (H) 09/15/2018  Lab Results  Component Value Date   HDL 69 09/15/2018   Lab Results  Component Value Date   LDLCALC 117 (H) 09/15/2018   Lab Results  Component Value Date   TRIG 67 09/15/2018   Lab Results  Component Value Date   CHOLHDL 2.9 09/15/2018   No results found for: LDLDIRECT  Glucose:  Glucose  Date Value Ref Range Status  02/18/2013 89 65 - 99 mg/dL Final   Glucose, Bld  Date Value Ref Range Status  09/15/2018 90 65 - 99 mg/dL Final    Comment:    .            Fasting reference interval .       [facility] 11/10/[facility]  AUDIT-C Score  0     No alcohol No tob  Dating STD testing and prevention (HIV/chl/gon/syphilis): 1 male partner in the last 3 years screening tests done last year (Jan 2020) all negative with the HIV test NR, declined repeat  testing Hep C: NR Jan 2020  Skin cancer: No concerning lesions, saw dermatologist and has eczema nose/ears Colorectal cancer: Denies family or personal history of colorectal cancer, no changes in BM's - no blood in stool, dark and tarry stool, mucus in stool, or constipation/diarrhea. Prostate cancer/Testicular cancer: No family history of prostate cancer  Lung cancer: N/A   Low Dose CT Chest recommended if Age 36-80 years, 30 pack-year currently smoking OR have quit w/in 15years. Patient does not qualify.   AAA: N/A    The USPSTF recommends one-time screening with ultrasonography in men ages 75 to 58 years who have ever smoked ECG:  History of heart murmur as child; no chest pain, shortness of breath, palpitations.   Advanced Care Planning: A voluntary discussion about advance care planning including the explanation and discussion of advance directives.  Discussed health care proxy and Living will, and the patient was able to identify a health care proxy as Mike Peters- Mike Peters.  Patient does not have a living will at present time. If patient does have living will, I have requested they bring this to the clinic to be scanned in to their chart.  Patient Active Problem List   Diagnosis Date Noted  . Prediabetes 08/20/2016  . GERD without esophagitis 05/17/2016    Past Surgical History:  Procedure Laterality Date  . NO PAST SURGERIES      Family History  Problem Relation Age of Onset  . Diabetes Mother        prediabetes    Social History   Socioeconomic History  . Marital status: Single    Spouse name: Not on file  . Number of children: Not on file  . Years of education: Not on file  . Highest education level: Not on file  Occupational History  . Not on file  Tobacco Use  . Smoking status: Never Smoker  . Smokeless tobacco: Never Used  Substance and Sexual Activity  . Alcohol use: Never  . Drug use: Never  . Sexual activity: Yes    Partners: Female    Birth  control/protection: Condom  Other Topics Concern  . Not on file  Social History Narrative   Lives with Mike Peters and brother - also has a Holiday representative and chihuahua    Social Determinants of Health   Financial Resource Strain:   . Difficulty of Paying Living Expenses:   Food Insecurity:   . Worried About Charity fundraiser in the Last  Year:   . Ran Out of Food in the Last Year:   Transportation Needs:   . Freight forwarder (Medical):   Marland Kitchen Lack of Transportation (Non-Medical):   Physical Activity:   . Days of Exercise per Week:   . Minutes of Exercise per Session:   Stress:   . Feeling of Stress :   Social Connections:   . Frequency of Communication with Friends and Family:   . Frequency of Social Gatherings with Friends and Family:   . Attends Religious Services:   . Active Member of Clubs or Organizations:   . Attends Banker Meetings:   Marland Kitchen Marital Status:   Intimate Partner Violence:   . Fear of Current or Ex-Partner:   . Emotionally Abused:   Marland Kitchen Physically Abused:   . Sexually Abused:    No longer taking pepcid Has albuterol just as a precaution   Current Outpatient Medications:  .  albuterol (PROVENTIL HFA;VENTOLIN HFA) 108 (90 Base) MCG/ACT inhaler, Inhale 2 puffs into the lungs every 6 (six) hours as needed for wheezing or shortness of breath., Disp: 1 Inhaler, Rfl: 0 .  famotidine (PEPCID) 20 MG tablet, Take 2 tablets (40 mg total) by mouth at bedtime., Disp: 180 tablet, Rfl: 1 .  levocetirizine (XYZAL) 5 MG tablet, Take 1 tablet (5 mg total) by mouth every evening., Disp: 90 tablet, Rfl: 1 .  fexofenadine (ALLEGRA ALLERGY) 60 MG tablet, Take 1 tablet (60 mg total) by mouth 2 (two) times daily., Disp: 60 tablet, Rfl: 5 .  fluticasone (FLONASE) 50 MCG/ACT nasal spray, Place 2 sprays into both nostrils daily., Disp: 16 g, Rfl: 6 .  omeprazole (PRILOSEC) 20 MG capsule, Take 1 capsule (20 mg total) by mouth daily., Disp: 30 capsule, Rfl: 1  No Known  Allergies   ROS: As noted above in HPI denies any recent unintentional weight loss, increased fatigue No recent fevers or other Covid concerning sx's No increase headaches, vision changes No CP, palpitations, No increased SOB,  No increased cough,  No persistent abdominal pains or change in bowel habits, + intermittent epigastric pains No rectal bleeding or dark/black stools,  No flank pains or dysuria,  No lower extremity swelling,  No increase joint aches or muscle aches,  No numbness, tingling or weakness in the extremities + difficulty focusing/concentration Denies other specific complaints on systems review except as noted in HPI    Objective  Vitals:   11/10/19 0808  BP: 120/70  Pulse: 70  Resp: 18  Temp: (!) 97.1 F (36.2 C)  TempSrc: Temporal  SpO2: 99%  Weight: 149 lb 11.2 oz (67.9 kg)  Height: 5\' 7"  (1.702 m)    Body mass index is 23.45 kg/m.  NAD, masked,pleasant HEENT - sclera anicteric, PERRL, EOMI, conj - non-inj'ed, No sinus tenderness, TM's and canals clear, pharynx clear Neck - supple, no adenopathy, no TM, carotids 2+ and = Car - RRR without m/g/r Pulm- CTA without wheeze or rales Abd - soft, NT, ND, BS+, no obvious HSM, no masses, no HSM, no epigastric tenderness on palpation today Back - no CVA tenderness Ext - no LE edema,  GU - no swelling in inguinal/suprapubic region, NT  Normal descended testes bilaterally, no masses palpated, no hernias, no lesions, no discharge Rectal: not done,  Neuro - affect was not flat, appropriate with conversation  Grossly non-focal with good strength on testing, sensation intact to LT in distal extremities, DTR's 2+ and = patella, Romberg neg, no pronator drift, good  balance on one foot, good finger to nose, good tandem walk, normal gait  PHQ2/9: [facility] screen [facility] 2/9 11/10/2019 09/29/2018 09/15/2018  Decreased Interest 0 0 0  Down, Depressed, Hopeless 0 0 0  PHQ - 2 Score 0 0 0  Altered sleeping 0 0 0   Tired, decreased energy 0 0 0  Change in appetite 0 0 0  Feeling bad or failure about yourself  0 0 0  Trouble concentrating 1 0 0  Moving slowly or fidgety/restless 0 0 0  Suicidal thoughts 0 0 0  PHQ-9 Score 1 0 0  Difficult doing work/chores Not difficult at all Not difficult at all Not difficult at all   Reviewed - reviewed, neg  Fall Risk: Fall Risk  11/10/2019 09/29/2018 09/15/2018  Falls in the past year? 0 0 0  Number falls in past yr: 0 0 0  Injury with Fall? 0 0 0  Follow up Falls evaluation completed Falls evaluation completed Falls evaluation completed     Assessment & Plan  1. General Health/Health Maintenance:   -USPSTF grade A and B recommendations reviewed with patient; age-appropriate recommendations, preventive care, screening tests, etc discussed and encouraged; healthy living encouraged; see AVS for any further patient education given to patient  -Discussed importance of regular physical activity weekly, and increasing physical activity can help with bowel motility and often reflux disease.  -Discussed importance of eating healthy diet in combination with GERD precautions   -Reviewed Health Maintenance:   Adult vaccines due  Topic Date Due  . TETANUS/TDAP  05/16/2025    2. Seasonal allergic rhinitis due to pollen Discussed that the over-the-counter Flonase product (fluticasone) is now recommended as the first drug to try to help with rhinitis symptoms.  Recommended using daily during the change of seasons when his symptoms are more problematic due to pollen.  If that alone not helpful enough, can add a nonsedating antihistamine in combination. He asked if I could put prescriptions through to his pharmacy, as that may be less costly discussed, and that was done. He asked for the generic Allegra product to try, and also prescribe that in addition to the [facility]Flonase product.  3. Elevated LDL cholesterol level He did want to recheck his lipids again today, and  that was ordered. - Lipid panel  4. GERD without esophagitis Discussed options, and educated on GERD and H. pylori.  We will check a CBC, and an H. pylori breath test.  If the H. pylori test is positive, informed him will need to add antibiotics to his regimen. Discussed the steps up versus the stepdown approach, and favor the latter, and added omeprazole-20 mg daily He will continue this for approximately 4 weeks and assess.  If his symptoms are better, will get the refill and continue and follow-up planned shortly after.  Discussed trying to wean away from this medicine and the importance of GERD precautions, with this information reviewed and included in his AVS.  We will discuss that further on follow-up, and await his response presently.  If symptoms not helped with the omeprazole, or more problematic, will refer to gastroenterology for possible scoping procedure also noted.  5. Difficulty concentrating/focusing Discussed his potential interruptions with sleep contributing.  Also discussed ADHD and this possibility, and noted if desires to pursue that evaluation, will obtain help from an outside agency to make the diagnosis before any medications entertained to help manage.  He wanted to hold off on pursuing that evaluation presently, although may in the future  if this is more persistent and problematic.  Schedule a follow-up in approximately 6 weeks, sooner as needed and await results of lab tests today.

## 2019-11-10 ENCOUNTER — Other Ambulatory Visit: Payer: Self-pay

## 2019-11-10 ENCOUNTER — Ambulatory Visit (INDEPENDENT_AMBULATORY_CARE_PROVIDER_SITE_OTHER): Payer: 59 | Admitting: Internal Medicine

## 2019-11-10 ENCOUNTER — Encounter: Payer: Self-pay | Admitting: Internal Medicine

## 2019-11-10 VITALS — BP 120/70 | HR 70 | Temp 97.1°F | Resp 18 | Ht 67.0 in | Wt 149.7 lb

## 2019-11-10 DIAGNOSIS — R41840 Attention and concentration deficit: Secondary | ICD-10-CM

## 2019-11-10 DIAGNOSIS — J301 Allergic rhinitis due to pollen: Secondary | ICD-10-CM | POA: Diagnosis not present

## 2019-11-10 DIAGNOSIS — Z0000 Encounter for general adult medical examination without abnormal findings: Secondary | ICD-10-CM

## 2019-11-10 DIAGNOSIS — K219 Gastro-esophageal reflux disease without esophagitis: Secondary | ICD-10-CM

## 2019-11-10 DIAGNOSIS — E782 Mixed hyperlipidemia: Secondary | ICD-10-CM | POA: Diagnosis not present

## 2019-11-10 MED ORDER — FLUTICASONE PROPIONATE 50 MCG/ACT NA SUSP: 2.0000 | Freq: Every day | NASAL | 6 refills | Status: DC

## 2019-11-10 MED ORDER — OMEPRAZOLE 20 MG PO CPDR: 20.0000 mg | DELAYED_RELEASE_CAPSULE | Freq: Every day | ORAL | 1 refills | Status: DC

## 2019-11-10 MED ORDER — FEXOFENADINE HCL 60 MG PO TABS: 60.0000 mg | ORAL_TABLET | Freq: Two times a day (BID) | ORAL | 5 refills | Status: DC

## 2019-11-10 NOTE — Patient Instructions (Signed)
To help with reflux (in addition to any medicines recommended): - Avoid the following foods: citrus, fatty foods, chocolate, peppermint, and excessive alcohol, along with sodas, orange juice (acidic drinks) - Stop eating at least 3 hours before going to bed, minimize naps/laying down after eating. - If you are overweight or obese, exercising and losing weight will also help your symptoms.   Gastroesophageal Reflux Disease, Adult Gastroesophageal reflux (GER) happens when acid from the stomach flows up into the tube that connects the mouth and the stomach (esophagus). Normally, food travels down the esophagus and stays in the stomach to be digested. With GER, food and stomach acid sometimes move back up into the esophagus. You may have a disease called gastroesophageal reflux disease (GERD) if the reflux:  Happens often.  Causes frequent or very bad symptoms.  Causes problems such as damage to the esophagus. When this happens, the esophagus becomes sore and swollen (inflamed). Over time, GERD can make small holes (ulcers) in the lining of the esophagus. What are the causes? This condition is caused by a problem with the muscle between the esophagus and the stomach. When this muscle is weak or not normal, it does not close properly to keep food and acid from coming back up from the stomach. The muscle can be weak because of:  Tobacco use.  Pregnancy.  Having a certain type of hernia (hiatal hernia).  Alcohol use.  Certain foods and drinks, such as coffee, chocolate, onions, and peppermint. What increases the risk? You are more likely to develop this condition if you:  Are overweight.  Have a disease that affects your connective tissue.  Use NSAID medicines. What are the signs or symptoms? Symptoms of this condition include:  Heartburn.  Difficult or painful swallowing.  The feeling of having a lump in the throat.  A bitter taste in the mouth.  Bad breath.  Having a lot  of saliva.  Having an upset or bloated stomach.  Belching.  Chest pain. Different conditions can cause chest pain. Make sure you see your doctor if you have chest pain.  Shortness of breath or noisy breathing (wheezing).  Ongoing (chronic) cough or a cough at night.  Wearing away of the surface of teeth (tooth enamel).  Weight loss. How is this treated? Treatment will depend on how bad your symptoms are. Your doctor may suggest:  Changes to your diet.  Medicine.  Surgery. Follow these instructions at home: Eating and drinking   Follow a diet as told by your doctor. You may need to avoid foods and drinks such as: ? Coffee and tea (with or without caffeine). ? Drinks that contain alcohol. ? Energy drinks and sports drinks. ? Bubbly (carbonated) drinks or sodas. ? Chocolate and cocoa. ? Peppermint and mint flavorings. ? Garlic and onions. ? Horseradish. ? Spicy and acidic foods. These include peppers, chili powder, curry powder, vinegar, hot sauces, and BBQ sauce. ? Citrus fruit juices and citrus fruits, such as oranges, lemons, and limes. ? Tomato-based foods. These include red sauce, chili, salsa, and pizza with red sauce. ? Fried and fatty foods. These include donuts, french fries, potato chips, and high-fat dressings. ? High-fat meats. These include hot dogs, rib eye steak, sausage, ham, and bacon. ? High-fat dairy items, such as whole milk, butter, and cream cheese.  Eat small meals often. Avoid eating large meals.  Avoid drinking large amounts of liquid with your meals.  Avoid eating meals during the 2-3 hours before bedtime.  Avoid lying  down right after you eat.  Do not exercise right after you eat. Lifestyle   Do not use any products that contain nicotine or tobacco. These include cigarettes, e-cigarettes, and chewing tobacco. If you need help quitting, ask your doctor.  Try to lower your stress. If you need help doing this, ask your doctor.  If you  are overweight, lose an amount of weight that is healthy for you. Ask your doctor about a safe weight loss goal. General instructions  Pay attention to any changes in your symptoms.  Take over-the-counter and prescription medicines only as told by your doctor. Do not take aspirin, ibuprofen, or other NSAIDs unless your doctor says it is okay.  Wear loose clothes. Do not wear anything tight around your waist.  Raise (elevate) the head of your bed about 6 inches (15 cm).  Avoid bending over if this makes your symptoms worse.  Keep all follow-up visits as told by your doctor. This is important. Contact a doctor if:  You have new symptoms.  You lose weight and you do not know why.  You have trouble swallowing or it hurts to swallow.  You have wheezing or a cough that keeps happening.  Your symptoms do not get better with treatment.  You have a hoarse voice. Get help right away if:  You have pain in your arms, neck, jaw, teeth, or back.  You feel sweaty, dizzy, or light-headed.  You have chest pain or shortness of breath.  You throw up (vomit) and your throw-up looks like blood or coffee grounds.  You pass out (faint).  Your poop (stool) is bloody or black.  You cannot swallow, drink, or eat. Summary  If a person has gastroesophageal reflux disease (GERD), food and stomach acid move back up into the esophagus and cause symptoms or problems such as damage to the esophagus.  Treatment will depend on how bad your symptoms are.  Follow a diet as told by your doctor.  Take all medicines only as told by your doctor. This information is not intended to replace advice given to you by your health care provider. Make sure you discuss any questions you have with your health care provider. Document Revised: 02/11/2018 Document Reviewed: 02/11/2018 Elsevier Patient Education  [place].

## 2019-11-11 LAB — CBC WITH DIFFERENTIAL/PLATELET
Absolute Monocytes: 474 cells/uL (ref 200–950)
Basophils Absolute: 32 cells/uL (ref 0–200)
Basophils Relative: 0.7 %
Eosinophils Absolute: 202 cells/uL (ref 15–500)
Eosinophils Relative: 4.4 %
HCT: 47.2 % (ref 38.5–50.0)
Hemoglobin: 15.6 g/dL (ref 13.2–17.1)
Lymphs Abs: 2249 cells/uL (ref 850–3900)
MCH: 27.7 pg (ref 27.0–33.0)
MCHC: 33.1 g/dL (ref 32.0–36.0)
MCV: 83.7 fL (ref 80.0–100.0)
MPV: 11.2 fL (ref 7.5–12.5)
Monocytes Relative: 10.3 %
Neutro Abs: 1642 cells/uL (ref 1500–7800)
Neutrophils Relative %: 35.7 %
Platelets: 224 10*3/uL (ref 140–400)
RBC: 5.64 10*6/uL (ref 4.20–5.80)
RDW: 12.6 % (ref 11.0–15.0)
Total Lymphocyte: 48.9 %
WBC: 4.6 10*3/uL (ref 3.8–10.8)

## 2019-11-11 LAB — LIPID PANEL
Cholesterol: 212 mg/dL — ABNORMAL HIGH (ref <200)
HDL: 64 mg/dL (ref >=40)
LDL Cholesterol (Calc): 132 mg/dL (calc) — ABNORMAL HIGH
Non-HDL Cholesterol (Calc): 148 mg/dL (calc) — ABNORMAL HIGH (ref <130)
Total CHOL/HDL Ratio: 3.3 (calc) (ref <5.0)
Triglycerides: 66 mg/dL (ref <150)

## 2019-11-11 LAB — H. PYLORI BREATH TEST: H. pylori Breath Test: NOT DETECTED

## 2019-12-23 NOTE — Progress Notes (Signed)
Patient ID: Mike Peters, male    DOB: 04-17-1991, 29 y.o.   MRN: [id number]  PCP: Hubbard Hartshorn, FNP  Chief Complaint  Patient presents with  . Follow-up  . Gastroesophageal Reflux    Subjective:   Mike Peters is a 29 y.o. male, presents to clinic with CC of the following:  Chief Complaint  Patient presents with  . Follow-up  . Gastroesophageal Reflux    HPI:  Patient is a 29 year old male who I first met approximately 6 weeks ago for his annual physical visit Follows up today for her issues of concern identified at that visit.  These included:   Seasonal allergic rhinitis due to pollen He notes that the symptoms have pretty much resolved with the pollen counts down.  The Allegra and Flonase products were helpful.  He has not been taking them very recent past is no longer needed.   Elevated LDL cholesterol level Lab Results  Component Value Date   CHOL 212 (H) 11/10/2019   HDL 64 11/10/2019   LDLCALC 132 (H) 11/10/2019   TRIG 66 11/10/2019   CHOLHDL 3.3 11/10/2019   TC - 202, LDL - 117 in Jan 2020 Noted his cholesterol and LDL cholesterol were up some from last check.  He notes he has really tried to change his diet, watching from a reflux standpoint, but also avoiding fast foods, trying to eat healthier. Not feel medication needed presently, and will continue with dietary modifications.    GERD without esophagitis He has tried the omeprazole product, first at night, but then had difficulty taking it at night as he often does not eat then, and changed to the morning.  He notes that the reflux really did not get better with this, still worsening symptoms in the early morning hours, usually about 4 or 5 in the morning and it wakes him up.  At times gets up and tries to eat something to help.  Still sleeps on a pillow wedge to help, and without the wedge, his symptoms are worse.    Has tried to watch his diet better and use reflux precautions.  No increasing  abdominal pains, no dysphagia. Just "burning" in the epigastric region, can climb up his chest when lays down, No N/V Has tried probiotics in the past as well, no real difference No nausea/vomiting, still no dark or black stools, no blood in the stools. Not taking NSAIDs.  H. Pylori test neg, CBC normal  Medication regimen - omeprazole-20 mg daily He notes he has been battling the symptoms since high school, and really would like to see gastroenterology to help and possibly have a scoping procedure done.  Lab Results  Component Value Date   WBC 4.6 11/10/2019   HGB 15.6 11/10/2019   HCT 47.2 11/10/2019   MCV 83.7 11/10/2019   PLT 224 11/10/2019     Difficulty concentrating/focusing Last visit the also noted a lack of focus, some difficulty concentrating.  No ADD/ADHD history.  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 Noted if desires to pursue an ADHD evaluation, will obtain help from an outside agency to make the diagnosis before any medications entertained to help manage.  He wanted to hold off on pursuing that evaluation last visit, and when brought that up again today, he continues to want to hold off on pursuing that. .  He brought 3 prescriptions and today he would like to get refill.  1 was for a ketoconazole  shampoo which she has been using for a dandruff type concern in the scalp.  It does work, and he has run out.  He was told to use it about every other day, and I recommended using it a couple times a week.  He also gets some redness in his ear area and sometimes on his face and the nasal bridge region and had a 2.5% hydrocortisone cream he applies.  He states he has tried to 1% over-the-counter and not as helpful.  I echoed concerns with a higher concentration steroid and using it on the face, okay for some of the involvement of the ear, and if uses on the face, use very sparingly and only when having acute flares.  He was understanding of that.  He also  wanted a refill of tretinoin, there is for bumps in the lip area.  Did not seem like it was for classic acne.  I did inform him it is now over-the-counter.  I can write a prescription for it as well, although typically use for more acne concerns.  He applies it nightly, and can continue, although warned it can dry the skin some, and not using it unless needed in the future.   Patient Active Problem List   Diagnosis Date Noted  . Mixed hyperlipidemia 11/10/2019  . Seasonal allergic rhinitis due to pollen 11/10/2019  . GERD without esophagitis 05/17/2016      Current Outpatient Medications:  .  omeprazole (PRILOSEC) 20 MG capsule, Take 1 capsule (20 mg total) by mouth daily., Disp: 30 capsule, Rfl: 1 .  albuterol (PROVENTIL HFA;VENTOLIN HFA) 108 (90 Base) MCG/ACT inhaler, Inhale 2 puffs into the lungs every 6 (six) hours as needed for wheezing or shortness of breath. (Patient not taking: Reported on 12/24/2019), Disp: 1 Inhaler, Rfl: 0 .  famotidine (PEPCID) 20 MG tablet, Take 2 tablets (40 mg total) by mouth at bedtime. (Patient not taking: Reported on 12/24/2019), Disp: 180 tablet, Rfl: 1 .  fexofenadine (ALLEGRA ALLERGY) 60 MG tablet, Take 1 tablet (60 mg total) by mouth 2 (two) times daily. (Patient not taking: Reported on 12/24/2019), Disp: 60 tablet, Rfl: 5 .  fluticasone (FLONASE) 50 MCG/ACT nasal spray, Place 2 sprays into both nostrils daily. (Patient not taking: Reported on 12/24/2019), Disp: 16 g, Rfl: 6 .  levocetirizine (XYZAL) 5 MG tablet, Take 1 tablet (5 mg total) by mouth every evening. (Patient not taking: Reported on 12/24/2019), Disp: 90 tablet, Rfl: 1   No Known Allergies   Past Surgical History:  Procedure Laterality Date  . NO PAST SURGERIES       Family History  Problem Relation Age of Onset  . Diabetes Mother        prediabetes     Social History   Tobacco Use  . Smoking status: Never Smoker  . Smokeless tobacco: Never Used  Substance Use Topics  . Alcohol  use: Never    With staff assistance, above reviewed with the patient today.  ROS: As per HPI, otherwise no specific complaints on a limited and focused system review   No results found for this or any previous visit (from the past 72 hour(s)).   PHQ2/9: Depression screen [facility] 2/9 12/24/2019 11/10/2019 09/29/2018 09/15/2018  Decreased Interest 0 0 0 0  Down, Depressed, Hopeless 0 0 0 0  PHQ - 2 Score 0 0 0 0  Altered sleeping 0 0 0 0  Tired, decreased energy 0 0 0 0  Change in appetite 0 0 0  0  Feeling bad or failure about yourself  0 0 0 0  Trouble concentrating 0 1 0 0  Moving slowly or fidgety/restless 0 0 0 0  Suicidal thoughts 0 0 0 0  PHQ-9 Score 0 1 0 0  Difficult doing work/chores Not difficult at all Not difficult at all Not difficult at all Not difficult at all   PHQ-2/9 Result is neg Fall Risk: Fall Risk  12/24/2019 11/10/2019 09/29/2018 09/15/2018  Falls in the past year? 0 0 0 0  Number falls in past yr: 0 0 0 0  Injury with Fall? 0 0 0 0  Follow up Falls evaluation completed Falls evaluation completed Falls evaluation completed Falls evaluation completed      Objective:   Vitals:   12/24/19 1410  BP: 120/72  Pulse: 78  Resp: 16  Temp: (!) 97.3 F (36.3 C)  TempSrc: Temporal  SpO2: 98%  Weight: 148 lb 1.6 oz (67.2 kg)  Height: '5\' 7"'  (1.702 m)    Body mass index is 23.2 kg/m.  Physical Exam   NAD, masked, pleasant HEENT - [place]/AT, sclera anicteric, conj - non-inj'ed, pharynx clear, no marked anterior process noted on the assessment today,  Neck - supple, no adenopathy, no TM, carotids 2+ and = without bruits bilat Car - RRR without m/g/r Abd - soft, NT, ND,  Skin-no marked erythema on the face today, nor on the ear regions where he occasionally has flares.  He did have very tiny raised bumps on the upper lip that were not acneiform lesions, (although noted he was prescribed the tretinoin for these previously and was helpful) Neuro/psychiatric - affect was not  flat, appropriate with conversation  Alert   Speech normal   Results for orders placed or performed in visit on 11/10/19  CBC with Differential/Platelet  Result Value Ref Range   WBC 4.6 3.8 - 10.8 Thousand/uL   RBC 5.64 4.20 - 5.80 Million/uL   Hemoglobin 15.6 13.2 - 17.1 g/dL   HCT 47.2 38.5 - 50.0 %   MCV 83.7 80.0 - 100.0 fL   MCH 27.7 27.0 - 33.0 pg   MCHC 33.1 32.0 - 36.0 g/dL   RDW 12.6 11.0 - 15.0 %   Platelets 224 140 - 400 Thousand/uL   MPV 11.2 7.5 - 12.5 fL   Neutro Abs 1,642 1,500 - 7,800 cells/uL   Lymphs Abs 2,249 850 - 3,900 cells/uL   Absolute Monocytes 474 200 - 950 cells/uL   Eosinophils Absolute 202 15 - 500 cells/uL   Basophils Absolute 32 0 - 200 cells/uL   Neutrophils Relative % 35.7 %   Total Lymphocyte 48.9 %   Monocytes Relative 10.3 %   Eosinophils Relative 4.4 %   Basophils Relative 0.7 %  H. pylori breath test  Result Value Ref Range   H. pylori Breath Test NOT DETECTED NOT DETECT  Lipid panel  Result Value Ref Range   Cholesterol 212 (H) <200 mg/dL   HDL 64 > OR = 40 mg/dL   Triglycerides 66 <150 mg/dL   LDL Cholesterol (Calc) 132 (H) mg/dL (calc)   Total CHOL/HDL Ratio 3.3 <5.0 (calc)   Non-HDL Cholesterol (Calc) 148 (H) <130 mg/dL (calc)       Assessment & Plan:   1. Seborrheic dermatitis Do feel continuing the ketoconazole shampoo for his scalp seborrheic dermatitis is reasonable.  He notes it is helpful, and will use a couple times a week recommended. May have a seborrheic dermatitis process on the face  intermittently, could be eczematous, especially in the area involving the ears. Can use the 2.5% hydrocortisone cream as needed, especially for the ear area, although strongly discouraged any liberal use of this on the face as it does thin the skin.  Can use for flares, sparingly, and monitor. The tretinoin product was helpful for the bump areas on the lip, although it is not a acne type of process, and did warn that the tretinoin  product can be drying.  Also noted it is over-the-counter now.  If not helping when applies, would not continue to do so.  And if more problematic and follow-up. - tretinoin (RETIN-A) 0.025 % cream; Apply topically at bedtime.  Dispense: 20 g; Refill: 1 - ketoconazole (NIZORAL) 2 % shampoo; Apply 1 application topically 2 (two) times a week.  Dispense: 120 mL; Refill: 5  2. Other eczema As above. - hydrocortisone 2.5 % cream; Apply topically 2 (two) times daily. As needed, Apply sparingly and minimal use on face  Dispense: 20 g; Refill: 1  3. GERD without esophagitis Discussed options and do feel it would be best to try to take the omeprazole product at nighttime if possible.  Also will increase the dose to 40 mg presently.  Discussed possibly changing it to a different product like Protonix, although given he wants to be referred to gastroenterology at this time, we will hold off on that, and await their further input. - omeprazole (PRILOSEC) 40 MG capsule; Take 1 capsule (40 mg total) by mouth daily.  Dispense: 30 capsule; Refill: 1 - Ambulatory referral to Gastroenterology  4. Mixed hyperlipidemia His labs were slightly worse on recent check, and emphasized the importance of dietary modification to help.  Do not feel medication is needed presently. We will continue to monitor presently.  5. Difficulty concentrating He again today did not want be referred for any further evaluation presently.  6. Seasonal allergic rhinitis due to pollen Noted the Flonase product and Allegra product were helpful, although with his allergy symptoms improving, he has not needed it in the recent past.  Can still utilize these as needed.  Await GI input, follow-up at the latest in a year, for his annual exam, sooner as needed.       Towanda Malkin, MD 12/24/19 2:32 PM

## 2019-12-24 ENCOUNTER — Encounter: Payer: Self-pay | Admitting: Internal Medicine

## 2019-12-24 ENCOUNTER — Other Ambulatory Visit: Payer: Self-pay

## 2019-12-24 ENCOUNTER — Ambulatory Visit (INDEPENDENT_AMBULATORY_CARE_PROVIDER_SITE_OTHER): Payer: 59 | Admitting: Internal Medicine

## 2019-12-24 VITALS — BP 120/72 | HR 78 | Temp 97.3°F | Resp 16 | Ht 67.0 in | Wt 148.1 lb

## 2019-12-24 DIAGNOSIS — R41840 Attention and concentration deficit: Secondary | ICD-10-CM

## 2019-12-24 DIAGNOSIS — L309 Dermatitis, unspecified: Secondary | ICD-10-CM | POA: Insufficient documentation

## 2019-12-24 DIAGNOSIS — E782 Mixed hyperlipidemia: Secondary | ICD-10-CM | POA: Diagnosis not present

## 2019-12-24 DIAGNOSIS — L219 Seborrheic dermatitis, unspecified: Secondary | ICD-10-CM

## 2019-12-24 DIAGNOSIS — K219 Gastro-esophageal reflux disease without esophagitis: Secondary | ICD-10-CM | POA: Diagnosis not present

## 2019-12-24 DIAGNOSIS — L308 Other specified dermatitis: Secondary | ICD-10-CM

## 2019-12-24 DIAGNOSIS — J301 Allergic rhinitis due to pollen: Secondary | ICD-10-CM

## 2019-12-24 MED ORDER — TRETINOIN 0.025 % EX CREA: TOPICAL_CREAM | Freq: Every day | CUTANEOUS | 1 refills | Status: DC

## 2019-12-24 MED ORDER — HYDROCORTISONE 2.5 % EX CREA: TOPICAL_CREAM | Freq: Two times a day (BID) | CUTANEOUS | 1 refills | Status: DC

## 2019-12-24 MED ORDER — OMEPRAZOLE 40 MG PO CPDR: 40.0000 mg | DELAYED_RELEASE_CAPSULE | Freq: Every day | ORAL | 1 refills | Status: DC

## 2019-12-24 MED ORDER — KETOCONAZOLE 2 % EX SHAM: 1.0000 "application " | MEDICATED_SHAMPOO | CUTANEOUS | 5 refills | Status: DC

## 2019-12-24 NOTE — Patient Instructions (Signed)
Referral to gastroenterology was placed today

## 2020-01-31 ENCOUNTER — Encounter: Payer: Self-pay | Admitting: Gastroenterology

## 2020-01-31 ENCOUNTER — Other Ambulatory Visit: Payer: Self-pay

## 2020-01-31 ENCOUNTER — Ambulatory Visit (INDEPENDENT_AMBULATORY_CARE_PROVIDER_SITE_OTHER): Payer: 59 | Admitting: Gastroenterology

## 2020-01-31 VITALS — BP 128/76 | HR 78 | Temp 98.8°F | Ht 67.0 in | Wt 149.5 lb

## 2020-01-31 DIAGNOSIS — K219 Gastro-esophageal reflux disease without esophagitis: Secondary | ICD-10-CM

## 2020-01-31 DIAGNOSIS — R1013 Epigastric pain: Secondary | ICD-10-CM

## 2020-01-31 DIAGNOSIS — G8929 Other chronic pain: Secondary | ICD-10-CM

## 2020-01-31 NOTE — Progress Notes (Signed)
Mike Darby, MD [address]  [place]  [place], [place] [address]  Main: [phone]  Fax: [phone]    Gastroenterology Consultation  Referring Provider:     Hubbard Hartshorn, FNP Primary Care Physician:  Mike Hartshorn, FNP Primary Gastroenterologist:  Dr. Cephas Peters Reason for Consultation:     Heartburn, epigastric pain        HPI:   Mike Peters is a 29 y.o. male referred by Dr. Uvaldo Peters, Mike Divine, FNP  for consultation & management of heartburn, epigastric pain.  Patient reports several years history of burning sensation in his throat that wakes him up around 1 AM about 3-4 times a week.  He has been using a wedge pillow which helps relieve sensation of burning in his throat.  However he reports burning in his stomach.  He sometimes reports nocturnal heartburn as well he tries to have early dinner no later than 5 PM and sometimes does not eat dinner due to his reflux symptoms.  He cannot identify any particular dietary triggers.  He is not obese, does not smoke, does not drink alcohol.  He was started on omeprazole 20 mg daily by his PCP which did not provide much relief.  He experienced soft stool.  Later, this was increased to 40 mg.  Patient requested to see gastroenterologist before starting 40 mg H. 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 Murmur     Past Surgical History:  Procedure Laterality Date  . NO PAST SURGERIES      Current Outpatient Medications:  .  albuterol (PROVENTIL HFA;VENTOLIN HFA) 108 (90 Base) MCG/ACT inhaler, Inhale 2 puffs into the lungs every 6 (six) hours as needed for wheezing or shortness of breath., Disp: 1 Inhaler, Rfl: 0   Family History  Problem Relation Age of Onset  . Diabetes Mother        prediabetes     Social History   Tobacco Use  .  Smoking status: Never Smoker  . Smokeless tobacco: Never Used  Vaping Use  . Vaping Use: Never used  Substance Use Topics  . Alcohol use: Never  . Drug use: Never    Allergies as of 01/31/2020  . (No Known Allergies)    Review of Systems:    All systems reviewed and negative except where noted in HPI.   Physical Exam:  BP 128/76 (BP Location: Left Arm, Patient Position: Sitting, Cuff Size: Normal)   Pulse 78   Temp 98.8 F (37.1 C) (Oral)   Ht 5\' 7"  (1.702 m)   Wt 149 lb 8 oz (67.8 kg)   BMI 23.42 kg/m  No LMP for male patient.  General:   Alert,  Well-developed, well-nourished, pleasant and cooperative in NAD Head:  Normocephalic and atraumatic. Eyes:  Sclera clear, no icterus.   Conjunctiva pink. Ears:  Normal auditory acuity. Nose:  No deformity, discharge, or lesions. Mouth:  No deformity or lesions,oropharynx pink & moist. Neck:  Supple; no masses or thyromegaly. Lungs:  Respirations even and unlabored.  Clear throughout to auscultation.   No wheezes, crackles, or rhonchi. No acute distress. Heart:  Regular rate and rhythm; no murmurs, clicks, rubs, or gallops. Abdomen:  Normal bowel sounds. Soft, non-tender and non-distended without masses, hepatosplenomegaly or hernias noted.  No guarding or rebound tenderness.   Rectal: Not  performed Msk:  Symmetrical without gross deformities. Good, equal movement & strength bilaterally. Pulses:  Normal pulses noted. Extremities:  No clubbing or edema.  No cyanosis. Neurologic:  Alert and oriented x3;  grossly normal neurologically. Skin:  Intact without significant lesions or rashes. No jaundice. Psych:  Alert and cooperative. Normal mood and affect.  Imaging Studies: None  Assessment and Plan:   Mike Peters is a 29 y.o. male with history of chronic GERD and epigastric pain with nocturnal episodes Low-dose PPI did not provide relief  Chronic GERD and epigastric pain H. pylori breath test negative Recommend EGD with  esophageal and gastric biopsies Discussed about antireflux lifestyle, information provided We will hold off on PPI until EGD is performed   Follow up after the EGD results   Mike Repress, MD

## 2020-01-31 NOTE — Patient Instructions (Signed)

## 2020-02-01 ENCOUNTER — Other Ambulatory Visit: Payer: Self-pay

## 2020-02-01 DIAGNOSIS — K219 Gastro-esophageal reflux disease without esophagitis: Secondary | ICD-10-CM

## 2020-02-03 ENCOUNTER — Other Ambulatory Visit
Admission: RE | Admit: 2020-02-03 | Discharge: 2020-02-03 | Disposition: A | Discharge status: Home / Self Care | Payer: 59 | Source: Ambulatory Visit | Attending: Gastroenterology | Admitting: Gastroenterology

## 2020-02-03 ENCOUNTER — Other Ambulatory Visit: Payer: Self-pay

## 2020-02-03 DIAGNOSIS — Z01812 Encounter for preprocedural laboratory examination: Secondary | ICD-10-CM | POA: Diagnosis present

## 2020-02-03 DIAGNOSIS — Z20822 Contact with and (suspected) exposure to covid-19: Secondary | ICD-10-CM | POA: Diagnosis not present

## 2020-02-03 LAB — SARS CORONAVIRUS 2 (TAT 6-24 HRS): SARS Coronavirus 2: NEGATIVE

## 2020-02-07 ENCOUNTER — Other Ambulatory Visit: Payer: Self-pay

## 2020-02-07 ENCOUNTER — Ambulatory Visit: Payer: 59 | Admitting: Certified Registered Nurse Anesthetist

## 2020-02-07 ENCOUNTER — Ambulatory Visit
Admission: RE | Admit: 2020-02-07 | Discharge: 2020-02-07 | Disposition: A | Discharge status: Home / Self Care | Payer: 59 | Source: Ambulatory Visit | Attending: Gastroenterology | Admitting: Gastroenterology

## 2020-02-07 ENCOUNTER — Encounter
Admission: RE | Disposition: A | Discharge status: Home / Self Care | Payer: Self-pay | Source: Ambulatory Visit | Attending: Gastroenterology

## 2020-02-07 ENCOUNTER — Encounter: Payer: Self-pay | Admitting: Gastroenterology

## 2020-02-07 DIAGNOSIS — K219 Gastro-esophageal reflux disease without esophagitis: Secondary | ICD-10-CM | POA: Insufficient documentation

## 2020-02-07 DIAGNOSIS — J45909 Unspecified asthma, uncomplicated: Secondary | ICD-10-CM | POA: Diagnosis not present

## 2020-02-07 DIAGNOSIS — K30 Functional dyspepsia: Secondary | ICD-10-CM | POA: Insufficient documentation

## 2020-02-07 DIAGNOSIS — Z79899 Other long term (current) drug therapy: Secondary | ICD-10-CM | POA: Insufficient documentation

## 2020-02-07 DIAGNOSIS — R12 Heartburn: Secondary | ICD-10-CM

## 2020-02-07 HISTORY — PX: ESOPHAGOGASTRODUODENOSCOPY (EGD) WITH PROPOFOL: SHX5813

## 2020-02-07 SURGERY — ESOPHAGOGASTRODUODENOSCOPY (EGD) WITH PROPOFOL
Anesthesia: General

## 2020-02-07 MED ORDER — PROPOFOL 500 MG/50ML IV EMUL
INTRAVENOUS | Status: DC | PRN
  Administered 2020-02-07: 160 ug/kg/min via INTRAVENOUS

## 2020-02-07 MED ORDER — OMEPRAZOLE 40 MG PO CPDR: 40.0000 mg | DELAYED_RELEASE_CAPSULE | Freq: Every day | ORAL | 1 refills | Status: DC

## 2020-02-07 MED ORDER — PROPOFOL 10 MG/ML IV BOLUS
INTRAVENOUS | Status: DC | PRN
  Administered 2020-02-07: 60 mg via INTRAVENOUS

## 2020-02-07 MED ORDER — MIDAZOLAM HCL 2 MG/2ML IJ SOLN
INTRAMUSCULAR | Status: DC | PRN
  Administered 2020-02-07: 2 mg via INTRAVENOUS

## 2020-02-07 MED ORDER — LIDOCAINE HCL (CARDIAC) PF 100 MG/5ML IV SOSY
PREFILLED_SYRINGE | INTRAVENOUS | Status: DC | PRN
  Administered 2020-02-07: 100 mg via INTRAVENOUS

## 2020-02-07 MED ORDER — SODIUM CHLORIDE 0.9 % IV SOLN
INTRAVENOUS | Status: DC
  Administered 2020-02-07: 20 mL/h via INTRAVENOUS

## 2020-02-07 MED ORDER — MIDAZOLAM HCL 2 MG/2ML IJ SOLN
INTRAMUSCULAR | Status: AC
  Filled 2020-02-07: qty 2

## 2020-02-07 NOTE — Anesthesia Preprocedure Evaluation (Signed)
Anesthesia Evaluation  Patient identified by MRN, date of birth, ID band Patient awake    Reviewed: Allergy & Precautions, H&P , NPO status , Patient's Chart, lab work & pertinent test results, reviewed documented beta blocker date and time   History of Anesthesia Complications Negative for: history of anesthetic complications  Airway Mallampati: I  TM Distance: >3 FB Neck ROM: full    Dental  (+) Dental Advidsory Given, Teeth Intact   Pulmonary neg shortness of breath, asthma (as a child, uses albuterol during pollen season) , sleep apnea , neg COPD, neg recent URI,    Pulmonary exam normal breath sounds clear to auscultation       Cardiovascular Exercise Tolerance: Good (-) hypertension(-) angina(-) Past MI and (-) Cardiac Stents Normal cardiovascular exam(-) dysrhythmias + Valvular Problems/Murmurs (as a child)  Rhythm:regular Rate:Normal     Neuro/Psych negative neurological ROS  negative psych ROS   GI/Hepatic Neg liver ROS, GERD  ,  Endo/Other  negative endocrine ROS  Renal/GU negative Renal ROS  negative genitourinary   Musculoskeletal   Abdominal   Peds  Hematology negative hematology ROS (+)   Anesthesia Other Findings Past Medical History: No date: Murmur   Reproductive/Obstetrics negative OB ROS                             Anesthesia Physical Anesthesia Plan  ASA: II  Anesthesia Plan: General   Post-op Pain Management:    Induction: Intravenous  PONV Risk Score and Plan: 2 and Propofol infusion and TIVA  Airway Management Planned: Natural Airway and Nasal Cannula  Additional Equipment:   Intra-op Plan:   Post-operative Plan:   Informed Consent: I have reviewed the patients History and Physical, chart, labs and discussed the procedure including the risks, benefits and alternatives for the proposed anesthesia with the patient or authorized representative who has  indicated his/her understanding and acceptance.     Dental Advisory Given  Plan Discussed with: Anesthesiologist, CRNA and Surgeon  Anesthesia Plan Comments:         Anesthesia Quick Evaluation

## 2020-02-07 NOTE — Op Note (Signed)
[facility] Gastroenterology Patient Name: Mike Peters Procedure Date: 02/07/2020 10:02 AM MRN: [id number] Account #: [id number] Date of Birth: [birth year] Admit Type: Outpatient Age: 29 Room: [facility] ENDO ROOM 1 Gender: Male Note Status: Finalized Procedure:             Upper GI endoscopy Indications:           Indigestion, Heartburn, Esophageal reflux symptoms                         that persist despite appropriate therapy Providers:             Lin Landsman MD, MD Referring MD:          no local Medicines:             Monitored Anesthesia Care Complications:         No immediate complications. Estimated blood loss: None. Procedure:             Pre-Anesthesia Assessment:                        - Prior to the procedure, a History and Physical was                         performed, and patient medications and allergies were                         reviewed. The patient is competent. The risks and                         benefits of the procedure and the sedation options and                         risks were discussed with the patient. All questions                         were answered and informed consent was obtained.                         Patient identification and proposed procedure were                         verified by the physician, the nurse, the                         anesthesiologist, the anesthetist and the technician                         in the pre-procedure area in the procedure room in the                         endoscopy suite. Mental Status Examination: alert and                         oriented. Airway Examination: normal oropharyngeal                         airway and neck mobility. Respiratory Examination:  clear to auscultation. CV Examination: normal.                         Prophylactic Antibiotics: The patient does not require                         prophylactic antibiotics. Prior Anticoagulants: The                          patient has taken no previous anticoagulant or                         antiplatelet agents. ASA Grade Assessment: II - A                         patient with mild systemic disease. After reviewing                         the risks and benefits, the patient was deemed in                         satisfactory condition to undergo the procedure. The                         anesthesia plan was to use monitored anesthesia care                         (MAC). Immediately prior to administration of                         medications, the patient was re-assessed for adequacy                         to receive sedatives. The heart rate, respiratory                         rate, oxygen saturations, blood pressure, adequacy of                         pulmonary ventilation, and response to care were                         monitored throughout the procedure. The physical                         status of the patient was re-assessed after the                         procedure.                        After obtaining informed consent, the endoscope was                         passed under direct vision. Throughout the procedure,                         the patient's blood pressure, pulse, and oxygen  saturations were monitored continuously. The Endoscope                         was introduced through the mouth, and advanced to the                         second part of duodenum. The upper GI endoscopy was                         accomplished without difficulty. The patient tolerated                         the procedure well. Findings:      The esophagus, gastroesophageal junction and examined esophagus were       normal.      The stomach was normal. Biopsies were taken with a cold forceps for       Helicobacter pylori testing.      The examined duodenum was normal.      Esophagogastric landmarks were identified: the gastroesophageal junction       was found  at 40 cm from the incisors. Impression:            - Normal esophagus. Biopsied.                        - Normal stomach.                        - Normal examined duodenum.                        - No specimens collected. Recommendation:        - Await pathology results.                        - Discharge patient to home (with escort).                        - Resume previous diet today.                        - Continue present medications.                        - Use Prilosec (omeprazole) 40 mg PO daily for 2                         months. Procedure Code(s):     --- Professional ---                        [phone], Esophagogastroduodenoscopy, flexible,                         transoral; with biopsy, single or multiple Diagnosis Code(s):     --- Professional ---                        R12, Heartburn                        K21.9, Gastro-esophageal reflux disease without  esophagitis                        K30, Functional dyspepsia CPT copyright 2019 American Medical Association. All rights reserved. The codes documented in this report are preliminary and upon coder review may  be revised to meet current compliance requirements. Dr. Ulyess Mort Lin Landsman MD, MD 02/07/2020 10:14:05 AM This report has been signed electronically. Number of Addenda: 0 Note Initiated On: 02/07/2020 10:02 AM Estimated Blood Loss:  Estimated blood loss: none.      [facility]

## 2020-02-07 NOTE — Transfer of Care (Signed)
Immediate Anesthesia Transfer of Care Note  Patient: Riven Mabile  Procedure(s) Performed: ESOPHAGOGASTRODUODENOSCOPY (EGD) WITH PROPOFOL (N/A )  Patient Location: PACU  Anesthesia Type:General  Level of Consciousness: drowsy  Airway & Oxygen Therapy: Patient Spontanous Breathing and Patient connected to nasal cannula oxygen  Post-op Assessment: Report given to RN and Post -op Vital signs reviewed and stable  Post vital signs: Reviewed and stable  Last Vitals:  Vitals Value Taken Time  BP 96/56 02/07/20 1015  Temp    Pulse 76 02/07/20 1016  Resp 20 02/07/20 1016  SpO2 99 % 02/07/20 1016  Vitals shown include unvalidated device data.  Last Pain:  Vitals:   02/07/20 0928  TempSrc: Temporal  PainSc: 0-No pain         Complications: No complications documented.

## 2020-02-07 NOTE — H&P (Signed)
Mike Darby, MD [address]  [place]  [place], [place] [address]  Main: [phone]  Fax: [phone] Pager: [phone]  Primary Care Physician:  Hubbard Hartshorn, FNP Primary Gastroenterologist:  Dr. Cephas Peters  Pre-Procedure History & Physical: HPI:  Mike Peters is a 29 y.o. male is here for an endoscopy.   Past Medical History:  Diagnosis Date  . Murmur     Past Surgical History:  Procedure Laterality Date  . NO PAST SURGERIES      Prior to Admission medications   Medication Sig Start Date End Date Taking? Authorizing Provider  albuterol (PROVENTIL HFA;VENTOLIN HFA) 108 (90 Base) MCG/ACT inhaler Inhale 2 puffs into the lungs every 6 (six) hours as needed for wheezing or shortness of breath. 09/29/18  Yes Hubbard Hartshorn, FNP    Allergies as of 01/31/2020  . (No Known Allergies)    Family History  Problem Relation Age of Onset  . Diabetes Mother        prediabetes    Social History   Socioeconomic History  . Marital status: Single    Spouse name: Not on file  . Number of children: Not on file  . Years of education: Not on file  . Highest education level: Not on file  Occupational History  . Not on file  Tobacco Use  . Smoking status: Never Smoker  . Smokeless tobacco: Never Used  Vaping Use  . Vaping Use: Never used  Substance and Sexual Activity  . Alcohol use: Never  . Drug use: Never  . Sexual activity: Yes    Partners: Female    Birth control/protection: Condom  Other Topics Concern  . Not on file  Social History Narrative   Lives with mom and brother - also has a Holiday representative and chihuahua    Social Determinants of Health   Financial Resource Strain:   . Difficulty of Paying Living Expenses:   Food Insecurity:   . Worried About Charity fundraiser in the Last Year:   . Arboriculturist in the Last Year:   Transportation Needs:   . Film/video editor (Medical):   Marland Kitchen Lack of Transportation (Non-Medical):   Physical  Activity:   . Days of Exercise per Week:   . Minutes of Exercise per Session:   Stress:   . Feeling of Stress :   Social Connections:   . Frequency of Communication with Friends and Family:   . Frequency of Social Gatherings with Friends and Family:   . Attends Religious Services:   . Active Member of Clubs or Organizations:   . Attends Archivist Meetings:   Marland Kitchen Marital Status:   Intimate Partner Violence:   . Fear of Current or Ex-Partner:   . Emotionally Abused:   Marland Kitchen Physically Abused:   . Sexually Abused:     Review of Systems: See HPI, otherwise negative ROS  Physical Exam: BP 116/77   Pulse (!) 57   Temp 97.6 F (36.4 C) (Temporal)   Resp 20   Ht 5\' 7"  (1.702 m)   Wt 65.8 kg   SpO2 99%   BMI 22.71 kg/m  General:   Alert,  pleasant and cooperative in NAD Head:  Normocephalic and atraumatic. Neck:  Supple; no masses or thyromegaly. Lungs:  Clear throughout to auscultation.    Heart:  Regular rate and rhythm. Abdomen:  Soft, nontender and nondistended. Normal bowel sounds, without guarding, and without rebound.   Neurologic:  Alert and  oriented x4;  grossly normal neurologically.  Impression/Plan: [name] is here for an endoscopy to be performed for Chronic GERD and epigastric pain  Risks, benefits, limitations, and alternatives regarding  endoscopy have been reviewed with the patient.  Questions have been answered.  All parties agreeable.   Lannette Donath, MD  02/07/2020, 9:45 AM

## 2020-02-07 NOTE — Anesthesia Postprocedure Evaluation (Signed)
Anesthesia Post Note  Patient: Mike Peters  Procedure(s) Performed: ESOPHAGOGASTRODUODENOSCOPY (EGD) WITH PROPOFOL (N/A )  Patient location during evaluation: Endoscopy Anesthesia Type: General Level of consciousness: awake and alert Pain management: pain level controlled Vital Signs Assessment: post-procedure vital signs reviewed and stable Respiratory status: spontaneous breathing, nonlabored ventilation, respiratory function stable and patient connected to nasal cannula oxygen Cardiovascular status: blood pressure returned to baseline and stable Postop Assessment: no apparent nausea or vomiting Anesthetic complications: no   No complications documented.   Last Vitals:  Vitals:   02/07/20 1025 02/07/20 1035  BP: (!) 86/46 (!) 96/57  Pulse: 67 62  Resp: 17 19  Temp:    SpO2: 93% 97%    Last Pain:  Vitals:   02/07/20 1035  TempSrc:   PainSc: 0-No pain                 Lenard Simmer

## 2020-02-08 ENCOUNTER — Encounter: Payer: Self-pay | Admitting: Gastroenterology

## 2020-02-08 ENCOUNTER — Telehealth: Payer: Self-pay

## 2020-02-08 LAB — SURGICAL PATHOLOGY

## 2020-02-08 NOTE — Telephone Encounter (Signed)
Patient verbalized understanding of results. Made follow up appointment  

## 2020-02-08 NOTE — Telephone Encounter (Signed)
-----   Message from Toney Reil, MD sent at 02/08/2020  9:42 AM EDT ----- Pathology results from upper endoscopy came back normal.  Continue omeprazole 40 mg once a day as prescribed Follow-up in 2 months  RV

## 2020-04-14 ENCOUNTER — Ambulatory Visit: Payer: 59 | Attending: Internal Medicine

## 2020-04-14 DIAGNOSIS — Z23 Encounter for immunization: Secondary | ICD-10-CM

## 2020-04-14 NOTE — Progress Notes (Signed)
   Covid-19 Vaccination Clinic  Name:  Mike Peters    MRN: [id number] DOB: 1991/06/02  04/14/2020  [name] was observed post Covid-19 immunization for 15 minutes without incident. He was provided with Vaccine Information Sheet and instruction to access the V-Safe system.   [name] was instructed to call 911 with any severe reactions post vaccine: Marland Kitchen Difficulty breathing  . Swelling of face and throat  . A fast heartbeat  . A bad rash all over body  . Dizziness and weakness   Immunizations Administered    Name Date Dose VIS Date Route   Pfizer COVID-19 Vaccine 04/14/2020  8:54 AM 0.3 mL 10/13/2018 Intramuscular   Manufacturer: [employer], [employer]   Lot: [id number]   NDC: [address]-7

## 2020-05-05 ENCOUNTER — Ambulatory Visit: Payer: 59 | Attending: Internal Medicine

## 2020-05-05 DIAGNOSIS — Z23 Encounter for immunization: Secondary | ICD-10-CM

## 2020-05-05 NOTE — Progress Notes (Signed)
   Covid-19 Vaccination Clinic  Name:  Mike Peters    MRN: [id number] DOB: 1991-07-26  05/05/2020  [name] was observed post Covid-19 immunization for 15 minutes without incident. He was provided with Vaccine Information Sheet and instruction to access the V-Safe system.   [name] was instructed to call 911 with any severe reactions post vaccine: Marland Kitchen Difficulty breathing  . Swelling of face and throat  . A fast heartbeat  . A bad rash all over body  . Dizziness and weakness   Immunizations Administered    Name Date Dose VIS Date Route   Pfizer COVID-19 Vaccine 05/05/2020 10:09 AM 0.3 mL 10/13/2018 Intramuscular   Manufacturer: [employer], [employer]   Lot: [id number]   NDC: [address]-2

## 2020-05-09 ENCOUNTER — Ambulatory Visit: Payer: 59 | Admitting: Gastroenterology

## 2020-05-17 ENCOUNTER — Ambulatory Visit: Payer: 59 | Admitting: Internal Medicine

## 2020-05-19 ENCOUNTER — Encounter: Payer: Self-pay | Admitting: Internal Medicine

## 2020-05-19 ENCOUNTER — Ambulatory Visit (INDEPENDENT_AMBULATORY_CARE_PROVIDER_SITE_OTHER): Payer: 59 | Admitting: Internal Medicine

## 2020-05-19 ENCOUNTER — Other Ambulatory Visit: Payer: Self-pay

## 2020-05-19 VITALS — BP 110/70 | HR 70 | Temp 97.6°F | Resp 16 | Ht 67.0 in | Wt 144.7 lb

## 2020-05-19 DIAGNOSIS — Z111 Encounter for screening for respiratory tuberculosis: Secondary | ICD-10-CM

## 2020-05-19 DIAGNOSIS — Z23 Encounter for immunization: Secondary | ICD-10-CM

## 2020-05-19 DIAGNOSIS — Z119 Encounter for screening for infectious and parasitic diseases, unspecified: Secondary | ICD-10-CM | POA: Diagnosis not present

## 2020-05-19 NOTE — Progress Notes (Signed)
Patient ID: Mike Peters, male    DOB: [birth year], 29 y.o.   MRN: [id number]  PCP: Jamelle Haring, MD  Chief Complaint  Patient presents with  . Tb screening  . Flu Vaccine  . Chicken pox screening    Subjective:   Mike Peters is a 29 y.o. male, presents to clinic with CC of the following:  Chief Complaint  Patient presents with  . Tb screening  . Flu Vaccine  . Chicken pox screening    HPI:  Patient is a 29 year old male Last visit with me was 12/24/2019 with a prior visit in March for an annual physical noted. Follows up today with request from his work for specific testing done.  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  He states he needs the two-step skin test for the TB screening, and I noted the blood test is often the gold standard and recommended, the QuantiFERON gold.  He states he needs to get the two-step skin test and not the blood test, and he is aware that the blood test is the test recommended and a better test to help assess.  When inquired, we do not have the supplies to do the skin test here. After further discussions with Melissa, he agreed to get the Quant gold test today and I do think that will be sufficient for the TB screening process.   He had no other complaints today.    Patient Active Problem List   Diagnosis Date Noted  . Seborrheic dermatitis 12/24/2019  . Eczema 12/24/2019  . Mixed hyperlipidemia 11/10/2019  . Seasonal allergic rhinitis due to pollen 11/10/2019  . Gastroesophageal reflux disease 05/17/2016      Current Outpatient Medications:  .  albuterol (PROVENTIL HFA;VENTOLIN HFA) 108 (90 Base) MCG/ACT inhaler, Inhale 2 puffs into the lungs every 6 (six) hours as needed for wheezing or shortness of breath. (Patient not taking: Reported on  05/19/2020), Disp: 1 Inhaler, Rfl: 0   No Known Allergies   Past Surgical History:  Procedure Laterality Date  . ESOPHAGOGASTRODUODENOSCOPY (EGD) WITH PROPOFOL N/A 02/07/2020   Procedure: ESOPHAGOGASTRODUODENOSCOPY (EGD) WITH PROPOFOL;  Surgeon: Toney Reil, MD;  Location: [facility] ENDOSCOPY;  Service: Gastroenterology;  Laterality: N/A;  . NO PAST SURGERIES       Family History  Problem Relation Age of Onset  . Diabetes Mother        prediabetes     Social History   Tobacco Use  . Smoking status: Never Smoker  . Smokeless tobacco: Never Used  Substance Use Topics  . Alcohol use: Never    With staff assistance, above reviewed with the patient today.  ROS: As per HPI, otherwise no specific complaints on a limited and focused system review   No results found for this or any previous visit (from the past 72 hour(s)).   PHQ2/9: Depression screen [facility] 2/9 05/19/2020 12/24/2019 11/10/2019 09/29/2018 09/15/2018  Decreased Interest 0 0 0 0 0  Down, Depressed, Hopeless 0 0 0 0 0  PHQ - 2 Score 0 0 0 0 0  Altered sleeping - 0 0 0 0  Tired, decreased energy - 0 0 0 0  Change in appetite - 0 0 0 0  Feeling bad or failure about yourself  -  0 0 0 0  Trouble concentrating - 0 1 0 0  Moving slowly or fidgety/restless - 0 0 0 0  Suicidal thoughts - 0 0 0 0  PHQ-9 Score - 0 1 0 0  Difficult doing work/chores - Not difficult at all Not difficult at all Not difficult at all Not difficult at all   PHQ-2/9 Result is neg  Fall Risk: Fall Risk  05/19/2020 12/24/2019 11/10/2019 09/29/2018 09/15/2018  Falls in the past year? 0 0 0 0 0  Number falls in past yr: 0 0 0 0 0  Injury with Fall? 0 0 0 0 0  Follow up Falls evaluation completed Falls evaluation completed Falls evaluation completed Falls evaluation completed Falls evaluation completed      Objective:   Vitals:   05/19/20 1003  BP: 110/70  Pulse: 70  Resp: 16  Temp: 97.6 F (36.4 C)  TempSrc: Oral  SpO2: 100%  Weight: 144 lb  11.2 oz (65.6 kg)  Height: 5\' 7"  (1.702 m)    Body mass index is 22.66 kg/m.  Physical Exam   NAD, masked, pleasant, healthy-appearing HEENT - [place]/AT, sclera anicteric, Neuro/psychiatric - affect was not flat, appropriate with conversation  Alert with normal speech   Results for orders placed or performed during the hospital encounter of 02/07/20  Surgical pathology  Result Value Ref Range   SURGICAL PATHOLOGY      SURGICAL PATHOLOGY CASE: [id number] PATIENT: [id number] Surgical Pathology Report     Specimen Submitted: A. Stomach, random; cbx B. Esophagus, random; cbx  Clinical History: GERD K21.9.  Normal upper endoscopy    DIAGNOSIS: A. STOMACH, RANDOM; COLD BIOPSY: - GASTRIC OXYNTIC MUCOSA WITH NO SIGNIFICANT HISTOPATHOLOGIC CHANGE. - NEGATIVE FOR H. PYLORI, DYSPLASIA, AND MALIGNANCY.  B. ESOPHAGUS, RANDOM; COLD BIOPSY: - BENIGN SQUAMOUS MUCOSA WITH MILD ACANTHOSIS; OTHERWISE NO SIGNIFICANT HISTOPATHOLOGIC CHANGE. - NO INCREASE IN INTRAEPITHELIAL EOSINOPHILS (LESS THAN 2 PER HPF). - NEGATIVE FOR DYSPLASIA AND MALIGNANCY.  GROSS DESCRIPTION: A. Labeled: Random stomach CBXs, dyspepsia Received: Formalin Tissue fragment(s): Multiple Size: Aggregate, 0.8 x 0.5 x 0.2 cm Description: Tan soft tissue fragments Entirely submitted in 1 cassette.  B. Labeled: Random esophageal CBXs, GERD Received: Formalin Tissue fragment(s): Multiple Size: Aggregate, 0.7 x 0. 4 x 0.1 cm Description: Tan-white translucent soft tissue fragments Entirely submitted in 1 cassette.   Final Diagnosis performed by Hermelinda Dellen, MD.   Electronically signed 02/08/2020 8:24:38AM The electronic signature indicates that the named Attending Pathologist has evaluated the specimen Technical component performed at [facility], [address] [place], [place] Lab: [phone] Dir: [phone], MD, MMM  Professional component performed at [facility], [facility],  [address] [place], [place], [place] Lab: [phone] Dir: [phone]. Georgiann Cocker, MD        Assessment & Plan:   1. Need for influenza vaccination - Flu Vaccine QUAD 6+ mos PF IM (Fluarix Quad PF)  2. Screening for tuberculosis As above noted, discussed that the QuantiFERON gold blood test is the gold standard for TB screening presently, and the preferred test in most settings.  He agreed to proceed with this.  - QuantiFERON-TB Gold Plus  3. Screening examination for infectious disease He noted he had the chickenpox previously, and a blood test can help confirm that with that test ordered. - Varicella zoster antibody, IgG'  He had no paperwork to complete today, just stated he needed these tests done for his work as well as getting the flu vaccine, and the results  will be released on my chart for him to which have and then provide for his employer as needed.  Await those results presently, and follow-up as needed in the future.   Jamelle Haring, MD 05/19/20 10:08 AM

## 2020-05-23 LAB — QUANTIFERON-TB GOLD PLUS
Mitogen-NIL: >10 IU/mL
NIL: 0.03 IU/mL
QuantiFERON-TB Gold Plus: NEGATIVE
TB1-NIL: 0 IU/mL
TB2-NIL: 0 IU/mL

## 2020-05-23 LAB — VARICELLA ZOSTER ANTIBODY, IGG: Varicella IgG: 2652 index

## 2020-06-12 ENCOUNTER — Ambulatory Visit (INDEPENDENT_AMBULATORY_CARE_PROVIDER_SITE_OTHER): Payer: 59 | Admitting: Gastroenterology

## 2020-06-12 ENCOUNTER — Encounter: Payer: Self-pay | Admitting: Gastroenterology

## 2020-06-12 ENCOUNTER — Other Ambulatory Visit: Payer: Self-pay

## 2020-06-12 VITALS — BP 123/75 | HR 71 | Temp 98.0°F | Ht 67.0 in | Wt 145.0 lb

## 2020-06-12 DIAGNOSIS — F489 Nonpsychotic mental disorder, unspecified: Secondary | ICD-10-CM

## 2020-06-12 NOTE — Progress Notes (Signed)
Arlyss Repress, MD [address]  [address]  [place], [place] [address]  Main: [phone]  Fax: [phone]    Gastroenterology Consultation  Referring Provider:     Doren Custard, FNP Primary Care Physician:  Jamelle Haring, MD Primary Gastroenterologist:  Dr. Arlyss Repress Reason for Consultation:     Heartburn, epigastric pain        HPI:   Mike Peters is a 29 y.o. male referred by Dr. Jamelle Haring, MD  for consultation & management of heartburn, epigastric pain.  Patient reports several years history of burning sensation in his throat that wakes him up around 1 AM about 3-4 times a week.  He has been using a wedge pillow which helps relieve sensation of burning in his throat.  However he reports burning in his stomach.  He sometimes reports nocturnal heartburn as well he tries to have early dinner no later than 5 PM and sometimes does not eat dinner due to his reflux symptoms.  He cannot identify any particular dietary triggers.  He is not obese, does not smoke, does not drink alcohol.  He was started on omeprazole 20 mg daily by his PCP which did not provide much relief.  He experienced soft stool.  Later, this was increased to 40 mg.  Patient requested to see gastroenterologist before starting 40 mg H. 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 Murmur     Past Surgical History:  Procedure Laterality Date  . ESOPHAGOGASTRODUODENOSCOPY (EGD) WITH PROPOFOL N/A 02/07/2020   Procedure: ESOPHAGOGASTRODUODENOSCOPY (EGD) WITH PROPOFOL;  Surgeon: Toney Reil, MD;  Location: [facility] ENDOSCOPY;  Service: Gastroenterology;  Laterality: N/A;  . NO PAST SURGERIES      Current Outpatient Medications:  .  albuterol (PROVENTIL HFA;VENTOLIN HFA) 108 (90 Base) MCG/ACT inhaler,  Inhale 2 puffs into the lungs every 6 (six) hours as needed for wheezing or shortness of breath., Disp: 1 Inhaler, Rfl: 0   Family History  Problem Relation Age of Onset  . Diabetes Mother        prediabetes     Social History   Tobacco Use  . Smoking status: Never Smoker  . Smokeless tobacco: Never Used  Vaping Use  . Vaping Use: Never used  Substance Use Topics  . Alcohol use: Never  . Drug use: Never    Allergies as of 06/12/2020  . (No Known Allergies)    Review of Systems:    All systems reviewed and negative except where noted in HPI.   Physical Exam:  BP 123/75 (BP Location: Left Arm, Patient Position: Sitting, Cuff Size: Normal)   Pulse 71   Temp 98 F (36.7 C) (Oral)   Ht 5\' 7"  (1.702 m)   Wt 145 lb (65.8 kg)   BMI 22.71 kg/m  No LMP for male patient.  General:   Alert,  Well-developed, well-nourished, pleasant and cooperative in NAD Head:  Normocephalic and atraumatic. Eyes:  Sclera clear, no icterus.   Conjunctiva pink. Ears:  Normal auditory acuity. Nose:  No deformity, discharge, or lesions. Mouth:  No deformity or lesions,oropharynx pink & moist. Neck:  Supple; no masses or thyromegaly. Lungs:  Respirations even and unlabored.  Clear throughout to auscultation.   No wheezes, crackles, or rhonchi. No acute distress. Heart:  Regular rate and rhythm; no murmurs,  clicks, rubs, or gallops. Abdomen:  Normal bowel sounds. Soft, non-tender and non-distended without masses, hepatosplenomegaly or hernias noted.  No guarding or rebound tenderness.   Rectal: Not performed Msk:  Symmetrical without gross deformities. Good, equal movement & strength bilaterally. Pulses:  Normal pulses noted. Extremities:  No clubbing or edema.  No cyanosis. Neurologic:  Alert and oriented x3;  grossly normal neurologically. Skin:  Intact without significant lesions or rashes. No jaundice. Psych:  Alert and cooperative. Normal mood and affect.  Imaging  Studies: None  Assessment and Plan:   Mike Peters is a 28 y.o. male with history of chronic GERD and epigastric pain with nocturnal episodes Low-dose PPI did not provide relief  Chronic GERD and epigastric pain H. pylori breath test negative Recommend EGD with esophageal and gastric biopsies Discussed about antireflux lifestyle, information provided We will hold off on PPI until EGD is performed   Follow up after the EGD results   Arlyss Repress, MD

## 2020-09-15 ENCOUNTER — Other Ambulatory Visit: Payer: Self-pay

## 2021-01-30 ENCOUNTER — Other Ambulatory Visit: Payer: Self-pay

## 2021-01-30 ENCOUNTER — Ambulatory Visit (INDEPENDENT_AMBULATORY_CARE_PROVIDER_SITE_OTHER): Payer: 59 | Admitting: Unknown Physician Specialty

## 2021-01-30 ENCOUNTER — Encounter: Payer: Self-pay | Admitting: Unknown Physician Specialty

## 2021-01-30 VITALS — BP 118/72 | HR 97 | Temp 98.4°F | Resp 18 | Ht 67.0 in | Wt 151.8 lb

## 2021-01-30 DIAGNOSIS — Z0000 Encounter for general adult medical examination without abnormal findings: Secondary | ICD-10-CM

## 2021-01-30 NOTE — Addendum Note (Signed)
Addended by: Davene Costain on: 01/30/2021 10:38 AM   Modules accepted: Orders

## 2021-01-30 NOTE — Progress Notes (Signed)
BP 118/72   Pulse 97   Temp 98.4 F (36.9 C) (Oral)   Resp 18   Ht 5\' 7"  (1.702 m)   Wt 151 lb 12.8 oz (68.9 kg)   SpO2 98%   BMI 23.78 kg/m    Subjective:    Patient ID: Mike Peters, male    DOB: March 24, 1991, 30 y.o.   MRN: [id number]/26/1990  HPI: Mike Peters is a 30 y.o. male  Chief Complaint  Patient presents with   Annual Exam   Reports pale stool.  History of reflux and had an endoscopy.  No abd pain, weight loss or weight gain  Relevant past medical, surgical, family and social history reviewed and updated as indicated. Interim medical history since our last visit reviewed.  Allergies and medications reviewed and updated.  Review of Systems  Constitutional: Negative.   HENT: Negative.    Eyes: Negative.   Respiratory: Negative.    Cardiovascular: Negative.   Gastrointestinal:  Negative for constipation and vomiting.  Genitourinary: Negative.   Skin: Negative.    Per HPI unless specifically indicated above     Objective:    BP 118/72   Pulse 97   Temp 98.4 F (36.9 C) (Oral)   Resp 18   Ht 5\' 7"  (1.702 m)   Wt 151 lb 12.8 oz (68.9 kg)   SpO2 98%   BMI 23.78 kg/m   Wt Readings from Last 3 Encounters:  01/30/21 151 lb 12.8 oz (68.9 kg)  06/12/20 145 lb (65.8 kg)  05/19/20 144 lb 11.2 oz (65.6 kg)    Physical Exam Constitutional:      Appearance: He is well-developed.  HENT:     Head: Normocephalic.     Right Ear: Tympanic membrane, ear canal and external ear normal.     Left Ear: Tympanic membrane, ear canal and external ear normal.     Mouth/Throat:     Pharynx: Uvula midline.  Eyes:     Pupils: Pupils are equal, round, and reactive to light.  Cardiovascular:     Rate and Rhythm: Normal rate and regular rhythm.     Heart sounds: Normal heart sounds. No murmur heard.   No friction rub. No gallop.  Pulmonary:     Effort: Pulmonary effort is normal. No respiratory distress.     Breath sounds: Normal breath sounds.  Abdominal:     General:  Bowel sounds are normal. There is no distension.     Palpations: Abdomen is soft.     Tenderness: There is no abdominal tenderness.  Musculoskeletal:        General: Normal range of motion.  Skin:    General: Skin is warm and dry.  Neurological:     Mental Status: He is alert and oriented to person, place, and time.     Deep Tendon Reflexes: Reflexes are normal and symmetric.  Psychiatric:        Behavior: Behavior normal.        Thought Content: Thought content normal.        Judgment: Judgment normal.        Assessment & Plan:   Problem List Items Addressed This Visit   None Visit Diagnoses     Routine general medical examination at a health care facility    -  Primary   Relevant Orders   CBC with Differential/Platelet   Comprehensive metabolic panel   Lipid Panel w/o Chol/HDL Ratio   TSH       Discussed diet  and exercise.  Check labs for light colored stool.   Follow up plan: Return in about 1 year (around 01/30/2022) for physical.

## 2021-01-30 NOTE — Patient Instructions (Signed)
Preventive Care 21-30 Years Old, Male Preventive care refers to lifestyle choices and visits with your health care provider that can promote health and wellness. This includes: A yearly physical exam. This is also called an annual wellness visit. Regular dental and eye exams. Immunizations. Screening for certain conditions. Healthy lifestyle choices, such as: Eating a healthy diet. Getting regular exercise. Not using drugs or products that contain nicotine and tobacco. Limiting alcohol use. What can I expect for my preventive care visit? Physical exam Your health care provider may check your: Height and weight. These may be used to calculate your BMI (body mass index). BMI is a measurement that tells if you are at a healthy weight. Heart rate and blood pressure. Body temperature. Skin for abnormal spots. Counseling Your health care provider may ask you questions about your: Past medical problems. Family's medical history. Alcohol, tobacco, and drug use. Emotional well-being. Home life and relationship well-being. Sexual activity. Diet, exercise, and sleep habits. Work and work environment. Access to firearms. What immunizations do I need?  Vaccines are usually given at various ages, according to a schedule. Your health care provider will recommend vaccines for you based on your age, medicalhistory, and lifestyle or other factors, such as travel or where you work. What tests do I need? Blood tests Lipid and cholesterol levels. These may be checked every 5 years starting at age 20. Hepatitis C test. Hepatitis B test. Screening  Diabetes screening. This is done by checking your blood sugar (glucose) after you have not eaten for a while (fasting). Genital exam to check for testicular cancer or hernias. STD (sexually transmitted disease) testing, if you are at risk. Talk with your health care provider about your test results, treatment options,and if necessary, the need for more  tests. Follow these instructions at home: Eating and drinking  Eat a healthy diet that includes fresh fruits and vegetables, whole grains, lean protein, and low-fat dairy products. Drink enough fluid to keep your urine pale yellow. Take vitamin and mineral supplements as recommended by your health care provider. Do not drink alcohol if your health care provider tells you not to drink. If you drink alcohol: Limit how much you have to 0-2 drinks a day. Be aware of how much alcohol is in your drink. In the [place] one drink equals one 12 oz bottle of beer (355 mL), one 5 oz glass of wine (148 mL), or one 1 oz glass of hard liquor (44 mL).  Lifestyle Take daily care of your teeth and gums. Brush your teeth every morning and night with fluoride toothpaste. Floss one time each day. Stay active. Exercise for at least 30 minutes 5 or more days each week. Do not use any products that contain nicotine or tobacco, such as cigarettes, e-cigarettes, and chewing tobacco. If you need help quitting, ask your health care provider. Do not use drugs. If you are sexually active, practice safe sex. Use a condom or other form of protection to prevent STIs (sexually transmitted infections). Find healthy ways to cope with stress, such as: Meditation, yoga, or listening to music. Journaling. Talking to a trusted person. Spending time with friends and family. Safety Always wear your seat belt while driving or riding in a vehicle. Do not drive: If you have been drinking alcohol. Do not ride with someone who has been drinking. When you are tired or distracted. While texting. Wear a helmet and other protective equipment during sports activities. If you have firearms in your house, make sure   you follow all gun safety procedures. Seek help if you have been physically or sexually abused. What's next? Go to your health care provider once a year for an annual wellness visit. Ask your health care provider how often  you should have your eyes and teeth checked. Stay up to date on all vaccines. This information is not intended to replace advice given to you by your health care provider. Make sure you discuss any questions you have with your healthcare provider. Document Revised: 04/21/2019 Document Reviewed: 07/30/2018 Elsevier Patient Education  2022 Elsevier Inc.  

## 2021-01-31 LAB — CBC WITH DIFFERENTIAL/PLATELET
Absolute Monocytes: 796 cells/uL (ref 200–950)
Basophils Absolute: 11 cells/uL (ref 0–200)
Basophils Relative: 0.3 %
Eosinophils Absolute: 111 cells/uL (ref 15–500)
Eosinophils Relative: 3 %
HCT: 46.4 % (ref 38.5–50.0)
Hemoglobin: 14.3 g/dL (ref 13.2–17.1)
Lymphs Abs: 1595 cells/uL (ref 850–3900)
MCH: 25.9 pg — ABNORMAL LOW (ref 27.0–33.0)
MCHC: 30.8 g/dL — ABNORMAL LOW (ref 32.0–36.0)
MCV: 84.1 fL (ref 80.0–100.0)
MPV: 11 fL (ref 7.5–12.5)
Monocytes Relative: 21.5 %
Neutro Abs: 1188 cells/uL — ABNORMAL LOW (ref 1500–7800)
Neutrophils Relative %: 32.1 %
Platelets: 224 10*3/uL (ref 140–400)
RBC: 5.52 10*6/uL (ref 4.20–5.80)
RDW: 12.4 % (ref 11.0–15.0)
Total Lymphocyte: 43.1 %
WBC: 3.7 10*3/uL — ABNORMAL LOW (ref 3.8–10.8)

## 2021-01-31 LAB — COMPLETE METABOLIC PANEL WITH GFR
AG Ratio: 1.8 (calc) (ref 1.0–2.5)
ALT: 34 U/L (ref 9–46)
AST: 25 U/L (ref 10–40)
Albumin: 4.5 g/dL (ref 3.6–5.1)
Alkaline phosphatase (APISO): 74 U/L (ref 36–130)
BUN: 10 mg/dL (ref 7–25)
CO2: 29 mmol/L (ref 20–32)
Calcium: 9.7 mg/dL (ref 8.6–10.3)
Chloride: 103 mmol/L (ref 98–110)
Creat: 0.95 mg/dL (ref 0.60–1.35)
GFR, Est African American: 124 mL/min/{1.73_m2} (ref >=60)
GFR, Est Non African American: 107 mL/min/{1.73_m2} (ref >=60)
Globulin: 2.5 g/dL (calc) (ref 1.9–3.7)
Glucose, Bld: 90 mg/dL (ref 65–99)
Potassium: 4.4 mmol/L (ref 3.5–5.3)
Sodium: 139 mmol/L (ref 135–146)
Total Bilirubin: 0.4 mg/dL (ref 0.2–1.2)
Total Protein: 7 g/dL (ref 6.1–8.1)

## 2021-01-31 LAB — LIPID PANEL
Cholesterol: 191 mg/dL (ref <200)
HDL: 56 mg/dL (ref >=40)
LDL Cholesterol (Calc): 118 mg/dL (calc) — ABNORMAL HIGH
Non-HDL Cholesterol (Calc): 135 mg/dL (calc) — ABNORMAL HIGH (ref <130)
Total CHOL/HDL Ratio: 3.4 (calc) (ref <5.0)
Triglycerides: 74 mg/dL (ref <150)

## 2021-01-31 LAB — TSH: TSH: 4.25 mIU/L (ref 0.40–4.50)

## 2022-02-20 ENCOUNTER — Ambulatory Visit (INDEPENDENT_AMBULATORY_CARE_PROVIDER_SITE_OTHER): Payer: 59 | Admitting: Nurse Practitioner

## 2022-02-20 ENCOUNTER — Encounter: Payer: Self-pay | Admitting: Nurse Practitioner

## 2022-02-20 ENCOUNTER — Other Ambulatory Visit: Payer: Self-pay

## 2022-02-20 VITALS — BP 120/78 | HR 69 | Temp 98.5°F | Resp 18 | Ht 67.0 in | Wt 157.2 lb

## 2022-02-20 DIAGNOSIS — R0683 Snoring: Secondary | ICD-10-CM

## 2022-02-20 DIAGNOSIS — J4520 Mild intermittent asthma, uncomplicated: Secondary | ICD-10-CM | POA: Diagnosis not present

## 2022-02-20 MED ORDER — ALBUTEROL SULFATE HFA 108 (90 BASE) MCG/ACT IN AERS: 2.0000 | INHALATION_SPRAY | Freq: Four times a day (QID) | RESPIRATORY_TRACT | 3 refills | Status: DC | PRN

## 2022-02-20 NOTE — Progress Notes (Signed)
BP 120/78   Pulse 69   Temp 98.5 F (36.9 C) (Oral)   Resp 18   Ht 5\' 7"  (1.702 m)   Wt 157 lb 3.2 oz (71.3 kg)   SpO2 97%   BMI 24.62 kg/m    Subjective:    Patient ID: Elius Etheredge, male    DOB: 1990/09/08, 31 y.o.   MRN: [id number]  HPI: Jaisen Wiltrout is a 31 y.o. male  Chief Complaint  Patient presents with   Sleep Apnea   Asthma    Med refill   Snoring/waking up gasping: He says he has been told that he does snore.He says sometimes he wakes up gasping and feels like he can not breath.  He says it has been going on for years.  She reports he wakes up and does not feel well rested.  He would like to get tested for sleep apnea.  ESS is 9.  Asthma: He says if exerts himself his asthma will flare other wise no issues.  He uses his albuterol when he needs it.  Last used two months ago.    Relevant past medical, surgical, family and social history reviewed and updated as indicated. Interim medical history since our last visit reviewed. Allergies and medications reviewed and updated.  Review of Systems  Constitutional: Negative for fever or weight change.  Respiratory: Negative for cough and shortness of breath.   Cardiovascular: Negative for chest pain or palpitations.  Gastrointestinal: Negative for abdominal pain, no bowel changes.  Musculoskeletal: Negative for gait problem or joint swelling.  Skin: Negative for rash.  Neurological: Negative for dizziness or headache.  No other specific complaints in a complete review of systems (except as listed in HPI above).      Objective:    BP 120/78   Pulse 69   Temp 98.5 F (36.9 C) (Oral)   Resp 18   Ht 5\' 7"  (1.702 m)   Wt 157 lb 3.2 oz (71.3 kg)   SpO2 97%   BMI 24.62 kg/m   Wt Readings from Last 3 Encounters:  02/20/22 157 lb 3.2 oz (71.3 kg)  01/30/21 151 lb 12.8 oz (68.9 kg)  06/12/20 145 lb (65.8 kg)    Physical Exam  Constitutional: Patient appears well-developed and well-nourished.  No distress.   HEENT: head atraumatic, normocephalic, pupils equal and reactive to light, neck supple Cardiovascular: Normal rate, regular rhythm and normal heart sounds.  No murmur heard. No BLE edema. Pulmonary/Chest: Effort normal and breath sounds normal. No respiratory distress. Abdominal: Soft.  There is no tenderness. Psychiatric: Patient has a normal mood and affect. behavior is normal. Judgment and thought content normal.  Results for orders placed or performed in visit on 01/30/21  CBC w/Diff/Platelet  Result Value Ref Range   WBC 3.7 (L) 3.8 - 10.8 Thousand/uL   RBC 5.52 4.20 - 5.80 Million/uL   Hemoglobin 14.3 13.2 - 17.1 g/dL   HCT 06/14/20 02/01/21 - 27.0 %   MCV 84.1 80.0 - 100.0 fL   MCH 25.9 (L) 27.0 - 33.0 pg   MCHC 30.8 (L) 32.0 - 36.0 g/dL   RDW 62.3 76.2 - 83.1 %   Platelets 224 140 - 400 Thousand/uL   MPV 11.0 7.5 - 12.5 fL   Neutro Abs 1,188 (L) 1,500 - 7,800 cells/uL   Lymphs Abs 1,595 850 - 3,900 cells/uL   Absolute Monocytes 796 200 - 950 cells/uL   Eosinophils Absolute 111 15 - 500 cells/uL   Basophils Absolute 11  0 - 200 cells/uL   Neutrophils Relative % 32.1 %   Total Lymphocyte 43.1 %   Monocytes Relative 21.5 %   Eosinophils Relative 3.0 %   Basophils Relative 0.3 %  COMPLETE METABOLIC PANEL WITH GFR  Result Value Ref Range   Glucose, Bld 90 65 - 99 mg/dL   BUN 10 7 - 25 mg/dL   Creat 7.62 8.31 - 5.17 mg/dL   GFR, Est Non African American 107 > OR = 60 mL/min/1.31m2   GFR, Est African American 124 > OR = 60 mL/min/1.54m2   BUN/Creatinine Ratio NOT APPLICABLE 6 - 22 (calc)   Sodium 139 135 - 146 mmol/L   Potassium 4.4 3.5 - 5.3 mmol/L   Chloride 103 98 - 110 mmol/L   CO2 29 20 - 32 mmol/L   Calcium 9.7 8.6 - 10.3 mg/dL   Total Protein 7.0 6.1 - 8.1 g/dL   Albumin 4.5 3.6 - 5.1 g/dL   Globulin 2.5 1.9 - 3.7 g/dL (calc)   AG Ratio 1.8 1.0 - 2.5 (calc)   Total Bilirubin 0.4 0.2 - 1.2 mg/dL   Alkaline phosphatase (APISO) 74 36 - 130 U/L   AST 25 10 - 40 U/L   ALT  34 9 - 46 U/L  TSH  Result Value Ref Range   TSH 4.25 0.40 - 4.50 mIU/L  Lipid panel  Result Value Ref Range   Cholesterol 191 <200 mg/dL   HDL 56 > OR = 40 mg/dL   Triglycerides 74 <616 mg/dL   LDL Cholesterol (Calc) 118 (H) mg/dL (calc)   Total CHOL/HDL Ratio 3.4 <5.0 (calc)   Non-HDL Cholesterol (Calc) 135 (H) <130 mg/dL (calc)      Assessment & Plan:   Problem List Items Addressed This Visit       Respiratory   Mild intermittent asthma without complication   Relevant Medications   albuterol (VENTOLIN HFA) 108 (90 Base) MCG/ACT inhaler   Other Visit Diagnoses     Snoring    -  Primary   Referral placed to pulmonology for sleep study.  Patient states he has been snoring and wakes up gasping for air.   Relevant Orders   Ambulatory referral to Pulmonology        Follow up plan: Return if symptoms worsen or fail to improve.

## 2022-02-20 NOTE — Assessment & Plan Note (Signed)
Patient reports that he has asthma that flares up when he does physical activity.  We will refill his albuterol inhaler.

## 2022-03-28 ENCOUNTER — Encounter: Payer: Self-pay | Admitting: Adult Health

## 2022-03-28 ENCOUNTER — Ambulatory Visit (INDEPENDENT_AMBULATORY_CARE_PROVIDER_SITE_OTHER): Payer: 59 | Admitting: Adult Health

## 2022-03-28 DIAGNOSIS — R0683 Snoring: Secondary | ICD-10-CM | POA: Diagnosis not present

## 2022-03-28 NOTE — Addendum Note (Signed)
Addended by: Delrae Rend on: 03/28/2022 12:33 PM   Modules accepted: Orders

## 2022-03-28 NOTE — Progress Notes (Signed)
@Patient  ID: , male    DOB: 09/12/1990, 31 y.o.   MRN: [id number]  Chief Complaint  Patient presents with   Consult    Referring provider: [id number], FNP  HPI: 31 year old male seen for sleep consult March 28, 2022 for snoring, daytime sleepiness, restless sleep and gasping for air during his sleep.  TEST/EVENTS :   03/28/2022 Sleep consult  Patient presents for sleep consult today.  Kindly referred by his primary care provider 05/28/2022, FNP. Patient complains that he snores has restless sleep.  Wakes up feeling tired and unrefreshed.  Feels tired throughout the day.  Patient says he has episodes where he wakes up gasping for air.  Symptoms been going on for a long time.  Patient says he typically goes to bed about 10 PM to midnight.  Takes less than 10 minutes to go to sleep.  Is up multiple times throughout the night.  Gets out of bed about 5:30 AM.  Does not operate heavy machinery for work.  Weight has been stable.  Current weight is at 157 pounds with a BMI at 25.  He has never had a sleep study before.  Has no history of congestive heart failure or stroke.  Has no removable dental work.  Epworth score is 14 out of 24.  Typically gets sleepy if he sits down to watch TV, in the afternoon hours or if he is a passenger in a car. No caffeine. Active , plays tennis. Takes Melatonin each night .   Medical history significant for asthma and seasonal allergies, Eczema.   Prior surgeries none  Social history.  Patient is single.  Has no children.  Patient is an [employer] Engineer, petroleum  Lives with his family. Is a never smoker.  No alcohol or drugs.  Family history is none  No Known Allergies  Immunization History  Administered Date(s) Administered   Hepatitis B, adult 11/30/2021   Influenza,inj,Quad PF,6+ Mos 05/19/2020, 06/18/2021   PFIZER(Purple Top)SARS-COV-2 Vaccination 04/14/2020, 05/05/2020   Tdap 05/17/2015    Past Medical History:  Diagnosis Date    Asthma    Murmur     Tobacco History: Social History   Tobacco Use  Smoking Status Never   Passive exposure: Past  Smokeless Tobacco Never   Counseling given: Not Answered   Outpatient Medications Prior to Visit  Medication Sig Dispense Refill   albuterol (VENTOLIN HFA) 108 (90 Base) MCG/ACT inhaler Inhale 2 puffs into the lungs every 6 (six) hours as needed for wheezing or shortness of breath. 1 each 3   No facility-administered medications prior to visit.     Review of Systems:   Constitutional:   No  weight loss, night sweats,  Fevers, chills,  +fatigue, or  lassitude.   HEENT:   No headaches,  Difficulty swallowing,  Tooth/dental problems, or  Sore throat,                No sneezing, itching, ear ache,  +nasal congestion, post nasal drip,   CV:  No chest pain,  Orthopnea, PND, swelling in lower extremities, anasarca, dizziness, palpitations, syncope.   GI  No heartburn, indigestion, abdominal pain, nausea, vomiting, diarrhea, change in bowel habits, loss of appetite, bloody stools.   Resp: No shortness of breath with exertion or at rest.  No excess mucus, no productive cough,  No non-productive cough,  No coughing up of blood.  No change in color of mucus.  No wheezing.  No chest wall  deformity  Skin: no rash or lesions.  GU: no dysuria, change in color of urine, no urgency or frequency.  No flank pain, no hematuria   MS:  No joint pain or swelling.  No decreased range of motion.  No back pain.    Physical Exam  BP 106/60 (BP Location: Left Arm, Patient Position: Sitting, Cuff Size: Normal)   Pulse 66   Temp 98.5 F (36.9 C) (Oral)   Ht 5\' 6"  (1.676 m)   Wt 157 lb 12.8 oz (71.6 kg)   SpO2 100%   BMI 25.47 kg/m   GEN: A/Ox3; pleasant , NAD, well nourished    EACs-clear, TMs-wnl, NOSE-clear, THROAT-clear, no lesions, no postnasal drip or exudate noted. Class 2 MP airway , elongated Uvula   NECK:  Supple w/ fair ROM; no JVD; normal carotid impulses  w/o bruits; no thyromegaly or nodules palpated; no lymphadenopathy.    RESP  Clear  P & A; w/o, wheezes/ rales/ or rhonchi. no accessory muscle use, no dullness to percussion  CARD:  RRR, no m/r/g, no peripheral edema, pulses intact, no cyanosis or clubbing.  GI:   Soft & nt; nml bowel sounds; no organomegaly or masses detected.   Musco: Warm bil, no deformities or joint swelling noted.   Neuro: alert, no focal deficits noted.    Skin: Warm, no lesions or rashes    Lab Results:    BNP No results found for: "BNP"  ProBNP No results found for: "PROBNP"  Imaging: No results found.        No data to display          No results found for: "NITRICOXIDE"      Assessment & Plan:   Snoring Snoring, daytime sleepiness, restless sleep, gasping for air during sleep all suspicious for underlying sleep apnea.  Patient education was given on sleep apnea.  Will set up for home sleep study.  - discussed how weight can impact sleep and risk for sleep disordered breathing - discussed options to assist with weight loss: combination of diet modification, cardiovascular and strength training exercises   - had an extensive discussion regarding the adverse health consequences related to untreated sleep disordered breathing - specifically discussed the risks for hypertension, coronary artery disease, cardiac dysrhythmias, cerebrovascular disease, and diabetes - lifestyle modification discussed   - discussed how sleep disruption can increase risk of accidents, particularly when driving - safe driving practices were discussed   Plan  Patient Instructions  Set up for home sleep study  Healthy sleep regimen  Follow up in 2 months to discuss sleep study results and treatment plan .        I spent   30 minutes dedicated to the care of this patient on the date of this encounter to include pre-visit review of records, face-to-face time with the patient discussing conditions above,  post visit ordering of testing, clinical documentation with the electronic health record, making appropriate referrals as documented, and communicating necessary findings to members of the patients care team.   , NP 03/28/2022

## 2022-03-28 NOTE — Progress Notes (Signed)
Reviewed and agree with assessment/plan.   Coralyn Helling, MD [facility] 03/28/2022, 11:27 AM Pager:  [phone]

## 2022-03-28 NOTE — Patient Instructions (Signed)
Set up for home sleep study  Healthy sleep regimen  Follow up in 2 months to discuss sleep study results and treatment plan .

## 2022-03-28 NOTE — Assessment & Plan Note (Signed)
Snoring, daytime sleepiness, restless sleep, gasping for air during sleep all suspicious for underlying sleep apnea.  Patient education was given on sleep apnea.  Will set up for home sleep study.  - discussed how weight can impact sleep and risk for sleep disordered breathing - discussed options to assist with weight loss: combination of diet modification, cardiovascular and strength training exercises   - had an extensive discussion regarding the adverse health consequences related to untreated sleep disordered breathing - specifically discussed the risks for hypertension, coronary artery disease, cardiac dysrhythmias, cerebrovascular disease, and diabetes - lifestyle modification discussed   - discussed how sleep disruption can increase risk of accidents, particularly when driving - safe driving practices were discussed   Plan  Patient Instructions  Set up for home sleep study  Healthy sleep regimen  Follow up in 2 months to discuss sleep study results and treatment plan .

## 2022-04-29 ENCOUNTER — Encounter: Payer: Self-pay | Admitting: Nurse Practitioner

## 2022-04-29 ENCOUNTER — Ambulatory Visit (INDEPENDENT_AMBULATORY_CARE_PROVIDER_SITE_OTHER): Payer: 59 | Admitting: Nurse Practitioner

## 2022-04-29 ENCOUNTER — Ambulatory Visit: Payer: Self-pay

## 2022-04-29 ENCOUNTER — Other Ambulatory Visit: Payer: Self-pay

## 2022-04-29 VITALS — Ht 67.0 in | Wt 158.6 lb

## 2022-04-29 DIAGNOSIS — L237 Allergic contact dermatitis due to plants, except food: Secondary | ICD-10-CM

## 2022-04-29 MED ORDER — TRIAMCINOLONE ACETONIDE 0.5 % EX OINT: 1.0000 | TOPICAL_OINTMENT | Freq: Two times a day (BID) | CUTANEOUS | 0 refills | Status: DC

## 2022-04-29 MED ORDER — TRIAMCINOLONE ACETONIDE 40 MG/ML IJ SUSP
40.0000 mg | Freq: Once | INTRAMUSCULAR | Status: AC
  Administered 2022-04-29: 40 mg via INTRAMUSCULAR

## 2022-04-29 MED ORDER — PREDNISONE 10 MG (21) PO TBPK: ORAL_TABLET | ORAL | 0 refills | Status: DC

## 2022-04-29 NOTE — Progress Notes (Signed)
Ht 5\' 7"  (1.702 m)   Wt 158 lb 9.6 oz (71.9 kg)   BMI 24.84 kg/m    Subjective:    Patient ID: Mike Peters, male    DOB: April 10, 1991, 31 y.o.   MRN: [id number]  HPI: [name] is a 31 y.o. male  Chief Complaint  Patient presents with   Poison Ivy   Poison ivy:  Patient reports he came in contact with poison ivy on Saturday. He says a tree fell on his car and when he removed the tree there was poison ivy on it.  He says he has a rash arms, hands and chin. He has tried calamine lotion, allegra, and hydrocortisone cream.  Will start patient on steroid taper, will give kenalog injection, prescribe kenalog cream and continue taking allegra. Patient declined vital signs.   Relevant past medical, surgical, family and social history reviewed and updated as indicated. Interim medical history since our last visit reviewed. Allergies and medications reviewed and updated.  Review of Systems  Constitutional: Negative for fever or weight change.  Respiratory: Negative for cough and shortness of breath.   Cardiovascular: Negative for chest pain or palpitations.  Gastrointestinal: Negative for abdominal pain, no bowel changes.  Musculoskeletal: Negative for gait problem or joint swelling.  Skin: positive for rash.  Neurological: Negative for dizziness or headache.  No other specific complaints in a complete review of systems (except as listed in HPI above).      Objective:    Ht 5\' 7"  (1.702 m)   Wt 158 lb 9.6 oz (71.9 kg)   BMI 24.84 kg/m   Wt Readings from Last 3 Encounters:  04/29/22 158 lb 9.6 oz (71.9 kg)  03/28/22 157 lb 12.8 oz (71.6 kg)  02/20/22 157 lb 3.2 oz (71.3 kg)    Physical Exam  Constitutional: Patient appears well-developed and well-nourished.  No distress.  HEENT: head atraumatic, normocephalic, pupils equal and reactive to light, neck supple Cardiovascular: Normal rate, regular rhythm and normal heart sounds.  No murmur heard. No BLE edema. Pulmonary/Chest:  Effort normal and breath sounds normal. No respiratory distress. Abdominal: Soft.  There is no tenderness. Skin: rash noted to bilateral arms and hands Psychiatric: Patient has a normal mood and affect. behavior is normal. Judgment and thought content normal.  Results for orders placed or performed in visit on 01/30/21  CBC w/Diff/Platelet  Result Value Ref Range   WBC 3.7 (L) 3.8 - 10.8 Thousand/uL   RBC 5.52 4.20 - 5.80 Million/uL   Hemoglobin 14.3 13.2 - 17.1 g/dL   HCT 04/23/22 02/01/21 - 82.9 %   MCV 84.1 80.0 - 100.0 fL   MCH 25.9 (L) 27.0 - 33.0 pg   MCHC 30.8 (L) 32.0 - 36.0 g/dL   RDW 93.7 16.9 - 67.8 %   Platelets 224 140 - 400 Thousand/uL   MPV 11.0 7.5 - 12.5 fL   Neutro Abs 1,188 (L) 1,500 - 7,800 cells/uL   Lymphs Abs 1,595 850 - 3,900 cells/uL   Absolute Monocytes 796 200 - 950 cells/uL   Eosinophils Absolute 111 15 - 500 cells/uL   Basophils Absolute 11 0 - 200 cells/uL   Neutrophils Relative % 32.1 %   Total Lymphocyte 43.1 %   Monocytes Relative 21.5 %   Eosinophils Relative 3.0 %   Basophils Relative 0.3 %  COMPLETE METABOLIC PANEL WITH GFR  Result Value Ref Range   Glucose, Bld 90 65 - 99 mg/dL   BUN 10 7 - 25 mg/dL  Creat 0.95 0.60 - 1.35 mg/dL   GFR, Est Non African American 107 > OR = 60 mL/min/1.57m2   GFR, Est African American 124 > OR = 60 mL/min/1.68m2   BUN/Creatinine Ratio NOT APPLICABLE 6 - 22 (calc)   Sodium 139 135 - 146 mmol/L   Potassium 4.4 3.5 - 5.3 mmol/L   Chloride 103 98 - 110 mmol/L   CO2 29 20 - 32 mmol/L   Calcium 9.7 8.6 - 10.3 mg/dL   Total Protein 7.0 6.1 - 8.1 g/dL   Albumin 4.5 3.6 - 5.1 g/dL   Globulin 2.5 1.9 - 3.7 g/dL (calc)   AG Ratio 1.8 1.0 - 2.5 (calc)   Total Bilirubin 0.4 0.2 - 1.2 mg/dL   Alkaline phosphatase (APISO) 74 36 - 130 U/L   AST 25 10 - 40 U/L   ALT 34 9 - 46 U/L  TSH  Result Value Ref Range   TSH 4.25 0.40 - 4.50 mIU/L  Lipid panel  Result Value Ref Range   Cholesterol 191 <200 mg/dL   HDL 56 > OR = 40  mg/dL   Triglycerides 74 <627 mg/dL   LDL Cholesterol (Calc) 118 (H) mg/dL (calc)   Total CHOL/HDL Ratio 3.4 <5.0 (calc)   Non-HDL Cholesterol (Calc) 135 (H) <130 mg/dL (calc)      Assessment & Plan:   Problem List Items Addressed This Visit   None Visit Diagnoses     Poison ivy dermatitis    -  Primary   continue taking allegar.  Will give kenalog injection, start steroid taper tomorrow.  May apply steroid cream two times a day.    Relevant Medications   triamcinolone acetonide (KENALOG-40) injection 40 mg (Start on 04/29/2022 12:15 PM)   triamcinolone ointment (KENALOG) 0.5 %   predniSONE (STERAPRED UNI-PAK 21 TAB) 10 MG (21) TBPK tablet        Follow up plan: Return if symptoms worsen or fail to improve.

## 2022-04-29 NOTE — Progress Notes (Signed)
pois

## 2022-04-29 NOTE — Telephone Encounter (Signed)
   Chief Complaint: Poison ivy rash to arms and chin Symptoms: Itching Frequency: Saturday Pertinent Negatives: Patient denies fever Disposition: [] ED /[] Urgent Care (no appt availability in office) / [x] Appointment(In office/virtual)/ []  [place] [facility]/ [] Home Care/ [] Refused Recommended Disposition /[] [place] [facility]/ []  Follow-up with PCP Additional Notes:   Reason for Disposition  [1] Face or genitals are involved AND [2] more than a small rash  Answer Assessment - Initial Assessment Questions 1. APPEARANCE of RASH: "Describe the rash."      Small black spots 2. LOCATION: "Where is the rash located?"  (e.g., face, genitals, hands, legs)     Arms and chin 3. SIZE: "How large is the rash?"      Moderate 4. ONSET: "When did the rash begin?"      Saturday 5. ITCHING: "Does the rash itch?" If Yes, ask: "How bad is it?"   - MILD - doesn't interfere with normal activities   - MODERATE-SEVERE: interferes with work, school, sleep, or other activities      2-3 6. EXPOSURE:  "How were you exposed to the plant (poison ivy, poison oak, sumac)"  "When were you exposed?"       Yes 7. PAST HISTORY: "Have you had a poison ivy rash before?" If Yes, ask: "How bad was it?"     Yes 8. PREGNANCY: "Is there any chance you are pregnant?" "When was your last menstrual period?"     N/A  Protocols used: Poison Ivy - [facility]

## 2022-05-13 ENCOUNTER — Ambulatory Visit: Payer: 59

## 2022-05-13 DIAGNOSIS — G4733 Obstructive sleep apnea (adult) (pediatric): Secondary | ICD-10-CM | POA: Diagnosis not present

## 2022-05-13 DIAGNOSIS — R0683 Snoring: Secondary | ICD-10-CM

## 2022-05-15 DIAGNOSIS — G4733 Obstructive sleep apnea (adult) (pediatric): Secondary | ICD-10-CM

## 2022-05-20 ENCOUNTER — Telehealth: Payer: Self-pay | Admitting: Adult Health

## 2022-05-20 NOTE — Telephone Encounter (Signed)
-----   Message from Donne Hazel sent at 05/15/2022  2:48 PM EDT ----- Hello this patient's HST report is ready for review

## 2022-05-20 NOTE — Telephone Encounter (Signed)
Home sleep study done on May 13, 2022 showed mild sleep apnea with AHI at 12.7/hour and SPO2 low at 91%.  Please set up office visit or virtual visit to discussed sleep study results in detail and treatment plan

## 2022-05-27 NOTE — Telephone Encounter (Signed)
ATC x1.  LVM to return call. 

## 2022-05-27 NOTE — Telephone Encounter (Signed)
Patient returning call.

## 2022-05-28 NOTE — Telephone Encounter (Signed)
Spoke to patient.  He is aware of sleep study results and voiced his understanding.  Mychart visit visit 06/07/2022 at 3:00. Nothing further needed.

## 2022-06-04 ENCOUNTER — Other Ambulatory Visit: Payer: Self-pay

## 2022-06-04 ENCOUNTER — Other Ambulatory Visit (HOSPITAL_COMMUNITY)
Admission: RE | Admit: 2022-06-04 | Discharge: 2022-06-04 | Disposition: A | Discharge status: Home / Self Care | Payer: 59 | Source: Ambulatory Visit | Attending: Nurse Practitioner | Admitting: Nurse Practitioner

## 2022-06-04 ENCOUNTER — Encounter: Payer: Self-pay | Admitting: Nurse Practitioner

## 2022-06-04 ENCOUNTER — Ambulatory Visit (INDEPENDENT_AMBULATORY_CARE_PROVIDER_SITE_OTHER): Payer: 59 | Admitting: Nurse Practitioner

## 2022-06-04 VITALS — BP 122/80 | HR 98 | Temp 98.6°F | Resp 18 | Ht 67.0 in | Wt 154.7 lb

## 2022-06-04 DIAGNOSIS — R631 Polydipsia: Secondary | ICD-10-CM

## 2022-06-04 DIAGNOSIS — Z111 Encounter for screening for respiratory tuberculosis: Secondary | ICD-10-CM | POA: Diagnosis not present

## 2022-06-04 DIAGNOSIS — R350 Frequency of micturition: Secondary | ICD-10-CM | POA: Insufficient documentation

## 2022-06-04 DIAGNOSIS — R3589 Other polyuria: Secondary | ICD-10-CM

## 2022-06-04 DIAGNOSIS — Z131 Encounter for screening for diabetes mellitus: Secondary | ICD-10-CM

## 2022-06-04 LAB — POCT URINALYSIS DIPSTICK
Bilirubin, UA: NEGATIVE
Blood, UA: NEGATIVE
Glucose, UA: NEGATIVE
Ketones, UA: NEGATIVE
Leukocytes, UA: NEGATIVE
Nitrite, UA: NEGATIVE
Protein, UA: NEGATIVE
Spec Grav, UA: 1.02 (ref 1.010–1.025)
Urobilinogen, UA: NEGATIVE E.U./dL — AB
pH, UA: 5 (ref 5.0–8.0)

## 2022-06-04 NOTE — Progress Notes (Signed)
BP 122/80   Pulse 98   Temp 98.6 F (37 C) (Oral)   Resp 18   Ht 5\' 7"  (1.702 m)   Wt 154 lb 11.2 oz (70.2 kg)   SpO2 99%   BMI 24.23 kg/m    Subjective:    Patient ID: Mike Peters, male    DOB: 1991/07/05, 31 y.o.   MRN: [id number]  HPI: Mike Peters is a 31 y.o. male  Chief Complaint  Patient presents with   Urinary Frequency   Urinary frequency:  patient reports he has had urinary frequency for sometime. He says he has noticed that he goes but does not empty his bladder. He says the second time he has a weak stream.  He says that he also dibbles sometimes.  He denies any fever, dysuria or pain. He is sexually active.  Will send urine for culture and std testing. Also checking for diabetes.  If no infection or diabetes will refer to urology.   IPSS Questionnaire (AUA-7): Over the past month.   1)  How often have you had a sensation of not emptying your bladder completely after you finish urinating?  4 - More than half the time  2)  How often have you had to urinate again less than two hours after you finished urinating? 4 - More than half the time  3)  How often have you found you stopped and started again several times when you urinated?  2 - Less than half the time  4) How difficult have you found it to postpone urination?  0 - Not at all  5) How often have you had a weak urinary stream?  1 - Less than 1 time in 5  6) How often have you had to push or strain to begin urination?  1 - Less than 1 time in 5  7) How many times did you most typically get up to urinate from the time you went to bed until the time you got up in the morning?  4 - 4 times  Total score:  0-7 mildly symptomatic   8-19 moderately symptomatic   20-35 severely symptomatic   Polyuria/polydipsia: He reports that he has been having increased thirst and urination.  Last A1C was 5.5 on 09/15/2018.  Will get labs.   Relevant past medical, surgical, family and social history reviewed and updated as  indicated. Interim medical history since our last visit reviewed. Allergies and medications reviewed and updated.  Review of Systems  Constitutional: Negative for fever or weight change.  Respiratory: Negative for cough and shortness of breath.   Cardiovascular: Negative for chest pain or palpitations.  Gastrointestinal: Negative for abdominal pain, no bowel changes.  GU: Positive for urinary frequency, urinary dribbling, weak stream negative for dysuria Musculoskeletal: Negative for gait problem or joint swelling.  Skin: Negative for rash.  Neurological: Negative for dizziness or headache.  No other specific complaints in a complete review of systems (except as listed in HPI above).      Objective:    BP 122/80   Pulse 98   Temp 98.6 F (37 C) (Oral)   Resp 18   Ht 5\' 7"  (1.702 m)   Wt 154 lb 11.2 oz (70.2 kg)   SpO2 99%   BMI 24.23 kg/m   Wt Readings from Last 3 Encounters:  06/04/22 154 lb 11.2 oz (70.2 kg)  04/29/22 158 lb 9.6 oz (71.9 kg)  03/28/22 157 lb 12.8 oz (71.6 kg)  Physical Exam  Constitutional: Patient appears well-developed and well-nourished.  No distress.  HEENT: head atraumatic, normocephalic, pupils equal and reactive to light,  neck supple Cardiovascular: Normal rate, regular rhythm and normal heart sounds.  No murmur heard. No BLE edema. Pulmonary/Chest: Effort normal and breath sounds normal. No respiratory distress. Abdominal: Soft.  There is no tenderness. Psychiatric: Patient has a normal mood and affect. behavior is normal. Judgment and thought content normal.  Results for orders placed or performed in visit on 06/04/22  POCT urinalysis dipstick  Result Value Ref Range   Color, UA yellow    Clarity, UA clear    Glucose, UA Negative Negative   Bilirubin, UA negative    Ketones, UA negative    Spec Grav, UA 1.020 1.010 - 1.025   Blood, UA negative    pH, UA 5.0 5.0 - 8.0   Protein, UA Negative Negative   Urobilinogen, UA negative (A) 0.2  or 1.0 E.U./dL   Nitrite, UA negative    Leukocytes, UA Negative Negative   Appearance clear    Odor none       Assessment & Plan:   Problem List Items Addressed This Visit   None Visit Diagnoses     Urinary frequency    -  Primary   Urine dip, urine culture and STD testing ordered.  We will also get A1c and CMP.  If no cause identified or improvement will refer to urology.   Relevant Orders   POCT urinalysis dipstick (Completed)   Urine Culture   Urine cytology ancillary only   Screening-pulmonary TB       Relevant Orders   QuantiFERON-TB Gold Plus   Polyuria       Will get C MP and A1c   Relevant Orders   COMPLETE METABOLIC PANEL WITH GFR   Hemoglobin A1c   Polydipsia       Will get C MP and A1c   Relevant Orders   COMPLETE METABOLIC PANEL WITH GFR   Hemoglobin A1c   Screening for diabetes mellitus       Relevant Orders   COMPLETE METABOLIC PANEL WITH GFR   Hemoglobin A1c        Follow up plan: Return if symptoms worsen or fail to improve.

## 2022-06-06 LAB — URINE CULTURE
MICRO NUMBER:: 14062667
Result:: NO GROWTH
SPECIMEN QUALITY:: ADEQUATE

## 2022-06-06 LAB — URINE CYTOLOGY ANCILLARY ONLY
Bacterial Vaginitis-Urine: NEGATIVE
Candida Urine: NEGATIVE
Chlamydia: NEGATIVE
Comment: NEGATIVE
Comment: NEGATIVE
Comment: NORMAL
Neisseria Gonorrhea: NEGATIVE
Trichomonas: NEGATIVE

## 2022-06-07 ENCOUNTER — Encounter: Payer: Self-pay | Admitting: Adult Health

## 2022-06-07 ENCOUNTER — Telehealth (INDEPENDENT_AMBULATORY_CARE_PROVIDER_SITE_OTHER): Payer: 59 | Admitting: Adult Health

## 2022-06-07 ENCOUNTER — Other Ambulatory Visit: Payer: Self-pay | Admitting: Nurse Practitioner

## 2022-06-07 DIAGNOSIS — R3912 Poor urinary stream: Secondary | ICD-10-CM

## 2022-06-07 DIAGNOSIS — R350 Frequency of micturition: Secondary | ICD-10-CM

## 2022-06-07 DIAGNOSIS — G4733 Obstructive sleep apnea (adult) (pediatric): Secondary | ICD-10-CM | POA: Diagnosis not present

## 2022-06-07 NOTE — Progress Notes (Signed)
Virtual Visit via Video Note  I connected with Mike Peters on 06/07/22 at  3:00 PM EDT by a video enabled telemedicine application and verified that I am speaking with the correct person using two identifiers.  Location: Patient: Home  Provider: Office    I discussed the limitations of evaluation and management by telemedicine and the availability of in person appointments. The patient expressed understanding and agreed to proceed.  History of Present Illness: 31 year old male seen for sleep consult March 28, 2022 for snoring, restless sleep and gasping for air during sleep found to have mild sleep apnea   Observations/Objective: 31yo male seen for sleep consult 03/28/22 for snoring and daytime sleepiness found to have mild sleep apnea.   Today's virtual visit is a follow up for sleep apnea. He was seen in August for sleep consult. Set up for home sleep study . Home sleep study done on May 13, 2022 showed mild sleep apnea with AHI at 12.7/hour and SPO2 low at 91%. We discussed sleep study results. Went over treatment options including oral appliance and CPAP . He wants to begin CPAP . Has significant symptom  burden. Also does not have dental insurance. Concern he will not be able to afford oral appliance.  Current student.    Past Medical History:  Diagnosis Date   Asthma    Murmur    . No current outpatient medications on file prior to visit.   No current facility-administered medications on file prior to visit.       Assessment and Plan: OSA -Mild  Discussed sleep study results and treatment plan   Will begin CPAP auto set 5-15cmH2O.   - discussed how weight can impact sleep and risk for sleep disordered breathing - discussed options to assist with weight loss: combination of diet modification, cardiovascular and strength training exercises   - had an extensive discussion regarding the adverse health consequences related to untreated sleep disordered breathing -  specifically discussed the risks for hypertension, coronary artery disease, cardiac dysrhythmias, cerebrovascular disease, and diabetes - lifestyle modification discussed   - discussed how sleep disruption can increase risk of accidents, particularly when driving - safe driving practices were discussed   Plan  Patient Instructions  Begin CPAP wear all night long, for at least 6hrs or more.  Do not drive if sleepy  Work on healthy weight loss  Do not drive if sleepy Follow up in 3 months and As needed     Follow Up Instructions:    I discussed the assessment and treatment plan with the patient. The patient was provided an opportunity to ask questions and all were answered. The patient agreed with the plan and demonstrated an understanding of the instructions.   The patient was advised to call back or seek an in-person evaluation if the symptoms worsen or if the condition fails to improve as anticipated.  I provided 20  minutes of non-face-to-face time during this encounter.   Rexene Edison, NP

## 2022-06-07 NOTE — Progress Notes (Signed)
Reviewed and agree with assessment/plan.   Azlin Zilberman, MD [facility] 06/07/2022, 4:46 PM Pager:  [phone]  

## 2022-06-07 NOTE — Patient Instructions (Addendum)
Begin CPAP wear all night long, for at least 6hrs or more.  Do not drive if sleepy  Work on healthy weight loss  Do not drive if sleepy Follow up in 3 months and As needed

## 2022-06-08 LAB — COMPLETE METABOLIC PANEL WITH GFR
AG Ratio: 1.9 (calc) (ref 1.0–2.5)
ALT: 19 U/L (ref 9–46)
AST: 18 U/L (ref 10–40)
Albumin: 4.6 g/dL (ref 3.6–5.1)
Alkaline phosphatase (APISO): 70 U/L (ref 36–130)
BUN: 16 mg/dL (ref 7–25)
CO2: 28 mmol/L (ref 20–32)
Calcium: 9.4 mg/dL (ref 8.6–10.3)
Chloride: 102 mmol/L (ref 98–110)
Creat: 1.18 mg/dL (ref 0.60–1.26)
Globulin: 2.4 g/dL (calc) (ref 1.9–3.7)
Glucose, Bld: 87 mg/dL (ref 65–99)
Potassium: 4.4 mmol/L (ref 3.5–5.3)
Sodium: 139 mmol/L (ref 135–146)
Total Bilirubin: 0.7 mg/dL (ref 0.2–1.2)
Total Protein: 7 g/dL (ref 6.1–8.1)
eGFR: 85 mL/min/{1.73_m2} (ref >=60)

## 2022-06-08 LAB — QUANTIFERON-TB GOLD PLUS
Mitogen-NIL: >10 IU/mL
NIL: 0.07 IU/mL
QuantiFERON-TB Gold Plus: NEGATIVE
TB1-NIL: <0 IU/mL
TB2-NIL: <0 IU/mL

## 2022-06-08 LAB — HEMOGLOBIN A1C
Hgb A1c MFr Bld: 5.7 % of total Hgb — ABNORMAL HIGH (ref <5.7)
Mean Plasma Glucose: 117 mg/dL
eAG (mmol/L): 6.5 mmol/L

## 2022-06-19 ENCOUNTER — Ambulatory Visit: Payer: 59 | Admitting: Urology

## 2022-06-19 ENCOUNTER — Encounter: Payer: Self-pay | Admitting: Urology

## 2022-06-19 VITALS — BP 130/80 | HR 83 | Ht 67.0 in | Wt 155.0 lb

## 2022-06-19 DIAGNOSIS — R3912 Poor urinary stream: Secondary | ICD-10-CM | POA: Diagnosis not present

## 2022-06-19 DIAGNOSIS — R350 Frequency of micturition: Secondary | ICD-10-CM

## 2022-06-19 LAB — MICROSCOPIC EXAMINATION

## 2022-06-19 LAB — URINALYSIS, COMPLETE
Bilirubin, UA: NEGATIVE
Glucose, UA: NEGATIVE
Ketones, UA: NEGATIVE
Leukocytes,UA: NEGATIVE
Nitrite, UA: NEGATIVE
Protein,UA: NEGATIVE
Specific Gravity, UA: 1.02 (ref 1.005–1.030)
Urobilinogen, Ur: 0.2 mg/dL (ref 0.2–1.0)
pH, UA: 5.5 (ref 5.0–7.5)

## 2022-06-19 LAB — BLADDER SCAN AMB NON-IMAGING

## 2022-06-19 NOTE — Patient Instructions (Signed)
We discussed the following is the differential diagnosis for your urinary symptoms:  1.  Pelvic floor dysfunction- discoordination between your pelvic floor muscles and your bladder.  Physical therapy could help with this.  2.  Urethral stricture-scar tissue in your urethra; a cystoscopy would rule this out which would allow [place] to directly visualize your urethra prostate and bladder  3.  Inflammatory prostatitis-often this is accompanied with genital and perineal pain as well as burning.  This seems less likely.  We often will just do supportive care and NSAIDs for this condition and often resolves on its own.  4.  Overactive bladder-bladder training including suppressing urinary urges, avoiding irritating beverages, and possibly even medications such as Ditropan could help with the urgency frequency symptoms.    Thank about the various options and let us know how you would like to proceed.

## 2022-06-19 NOTE — Progress Notes (Signed)
06/19/2022 1:11 PM   Mike Peters 01/20/1991 [id number]  Referring provider: Bo Peters, Mike Peters [name],  [name] [address]  Chief Complaint  Patient presents with   Urinary Frequency    HPI: 31 year old male who presents today for further evaluation of intermittent urinary and sensation of incomplete bladder emptying.  Urinalysis on 06/04/2022 negative, no evidence of UTI.  Hemoglobin A1c 5.7.  Urine cytology for gonorrhea, chlamydia, trichomonas were all negative.  He reports today over the last several months, he has had increased urinary frequency, sensation of bladder pressure like he needs to urinate but when he gets there, he cannot go.  When he goes to the urinal, he will stand there for a period of time, have to take a deep breath hold it and push down in order to empty his bladder.  He denies a weak stream.  Ultimately, he does feel like he is able to empty okay.  He denies any spraying of his urinary stream.  He only gets up about once around 5 AM to urinate.  He denies any genital pain perineal pain or any other prostatitis type symptoms.  He denies any dysuria, gross hematuria, or history of sexually transmitted infections.  No penile discharge.  He does not recall a history of this as a child.  No pediatric GU history.  He drinks primarily just water.  He used to drink excessively but has cut back and since then, he has not noticed any improvement in his urinary symptoms.     IPSS     Row Name 06/19/22 0900         International Prostate Symptom Score   How often have you had the sensation of not emptying your bladder? Almost always     How often have you had to urinate less than every two hours? Almost always     How often have you found you stopped and started again several times when you urinated? More than half the time     How often have you found it difficult to postpone urination? Not at All     How often have you had a weak  urinary stream? More than half the time     How often have you had to strain to start urination? More than half the time     How many times did you typically get up at night to urinate? 1 Time     Total IPSS Score 23       Quality of Life due to urinary symptoms   If you were to spend the rest of your life with your urinary condition just the way it is now how would you feel about that? Mixed              Score:  1-7 Mild 8-19 Moderate 20-35 Severe    PMH: Past Medical History:  Diagnosis Date   Asthma    Murmur     Surgical History: Past Surgical History:  Procedure Laterality Date   ESOPHAGOGASTRODUODENOSCOPY (EGD) WITH PROPOFOL N/A 02/07/2020   Procedure: ESOPHAGOGASTRODUODENOSCOPY (EGD) WITH PROPOFOL;  Surgeon: Lin Landsman, MD;  Location: [facility];  Service: Gastroenterology;  Laterality: N/A;   NO PAST SURGERIES      Home Medications:  Allergies as of 06/19/2022   No Known Allergies      Medication List    as of June 19, 2022  1:11 PM   You have not been prescribed any medications.  Allergies: No Known Allergies  Family History: Family History  Problem Relation Age of Onset   Diabetes Mother        prediabetes    Social History:  reports that he has never smoked. He has been exposed to tobacco smoke. He has never used smokeless tobacco. He reports that he does not drink alcohol and does not use drugs.   Physical Exam: BP 130/80   Pulse 83   Ht 5\' 7"  (1.702 m)   Wt 155 lb (70.3 kg)   BMI 24.28 kg/m   Constitutional:  Alert and oriented, No acute distress. HEENT: [place] AT, moist mucus membranes.  Trachea midline, no masses. Cardiovascular: No clubbing, cyanosis, or edema. Respiratory: Normal respiratory effort, no increased work of breathing. Neurologic: Grossly intact, no focal deficits, moving all 4 extremities. Psychiatric: Normal mood and affect.  Laboratory Data: Lab Results  Component Value Date   WBC 3.7 (L)  01/30/2021   HGB 14.3 01/30/2021   HCT 46.4 01/30/2021   MCV 84.1 01/30/2021   PLT 224 01/30/2021    Lab Results  Component Value Date   CREATININE 1.18 06/04/2022     Lab Results  Component Value Date   HGBA1C 5.7 (H) 06/04/2022    Urinalysis Ua negative  Pertinent Imaging: Results for orders placed or performed in visit on 06/19/22  Bladder Scan (Post Void Residual) in office  Result Value Ref Range   Scan Result 43ml     Assessment & Plan:    1. Urinary frequency No indication this is an infectious process, diabetes, neurological or polydipsia based on his assessment and labs.  There is no alarm symptoms or anything concerning for significant underlying pathology.  He is emptying his bladder well.    We discussed the differential diagnosis today which includes pelvic floor dysfunction especially given that he has difficulty starting his stream when he has the urge to urinate and has to do certain maneuvers.  If this is the case, he would benefit from pelvic floor therapy.  Urethral stricture also is on the differential diagnosis although he does not have any spraying of his urinary stream and ultimately is able to empty his bladder.  I offered him cystoscopy which he will consider.  This also could be related to inflammatory prostatitis although generally this is accompanied by perineal pain, genital pain, dysuria and other symptoms which he does not indicate.  We discussed supportive care and consideration of NSAIDs although he has difficulty tolerating this given his history of GERD.  Lastly, he could have overactive bladder and we discussed behavioral training, attempting to suppress his urges, avoiding of irritating beverages or potentially trying an overactive bladder medication.  After discussing each of these possibilities, he like to think about his options and let us know.  I did outline all of these options for trial and error type of treatment and move forward  pending on how his symptoms progress or potentially regress in his desire to invervene.   - Urinalysis, Complete   Return if symptoms worsen or fail to improve.  Hollice Espy, MD  [facility] [address] [place], [place] [address] [phone]  I spent 46 total minutes on the day of the encounter including pre-visit review of the medical record, face-to-face time with the patient, and post visit ordering of labs/imaging/tests.

## 2022-06-21 ENCOUNTER — Telehealth: Payer: Self-pay | Admitting: Adult Health

## 2022-06-24 ENCOUNTER — Other Ambulatory Visit: Payer: Self-pay | Admitting: *Deleted

## 2022-06-24 DIAGNOSIS — G4733 Obstructive sleep apnea (adult) (pediatric): Secondary | ICD-10-CM

## 2022-06-24 NOTE — Telephone Encounter (Signed)
ATC patient x1.  LVM that machine has been ordered and DME should be contacting him to set him up with CPAP machine.  Order placed for CPAP machine.

## 2022-06-28 ENCOUNTER — Other Ambulatory Visit: Payer: Self-pay

## 2022-06-28 ENCOUNTER — Ambulatory Visit: Payer: 59 | Admitting: Nurse Practitioner

## 2022-06-28 ENCOUNTER — Ambulatory Visit (INDEPENDENT_AMBULATORY_CARE_PROVIDER_SITE_OTHER): Payer: 59 | Admitting: Nurse Practitioner

## 2022-06-28 ENCOUNTER — Encounter: Payer: Self-pay | Admitting: Nurse Practitioner

## 2022-06-28 VITALS — BP 120/68 | HR 73 | Temp 98.0°F | Resp 18 | Ht 67.0 in | Wt 154.1 lb

## 2022-06-28 DIAGNOSIS — J4520 Mild intermittent asthma, uncomplicated: Secondary | ICD-10-CM | POA: Diagnosis not present

## 2022-06-28 DIAGNOSIS — R053 Chronic cough: Secondary | ICD-10-CM

## 2022-06-28 DIAGNOSIS — J069 Acute upper respiratory infection, unspecified: Secondary | ICD-10-CM

## 2022-06-28 MED ORDER — PROMETHAZINE-DM 6.25-15 MG/5ML PO SYRP: 5.0000 mL | ORAL_SOLUTION | Freq: Every evening | ORAL | 0 refills | Status: DC | PRN

## 2022-06-28 MED ORDER — AZITHROMYCIN 250 MG PO TABS: ORAL_TABLET | ORAL | 0 refills | Status: AC

## 2022-06-28 MED ORDER — PREDNISONE 10 MG (21) PO TBPK: ORAL_TABLET | ORAL | 0 refills | Status: DC

## 2022-06-28 MED ORDER — BENZONATATE 100 MG PO CAPS: 200.0000 mg | ORAL_CAPSULE | Freq: Two times a day (BID) | ORAL | 0 refills | Status: DC | PRN

## 2022-06-28 NOTE — Progress Notes (Signed)
BP 120/68   Pulse 73   Temp 98 F (36.7 C) (Oral)   Resp 18   Ht 5\' 7"  (1.702 m)   Wt 154 lb 1.6 oz (69.9 kg)   SpO2 98%   BMI 24.14 kg/m    Subjective:    Patient ID: Mike Peters, male    DOB: [birth year], 31 y.o.   MRN: [id number]  HPI: Mike Peters is a 31 y.o. male  Chief Complaint  Patient presents with   URI    Cough, congestion for over 2 weeks   URI/persistent cough:  Patient reports symptoms started three weeks ago. He reports that he has been cough, chest congestion, and nasal congestion. He denies any fever or shortness of breath. He has been taking mucinex, flonase, allegra and decongestant. Discussed starting steroid taper, azithromycin and tessalon perls, if no improvement will send for chest xray. Push fluids and get rest.   Asthma: Patient reports he has been using his albuterol inhaler and it has not been helping. Recommend starting steroid taper and will prescribe azithromycin.   Relevant past medical, surgical, family and social history reviewed and updated as indicated. Interim medical history since our last visit reviewed. Allergies and medications reviewed and updated.  Review of Systems  Constitutional: Negative for fever or weight change.  HEENT: positive for nasal congestion Respiratory: positive for cough and negative for shortness of breath.   Cardiovascular: Negative for chest pain or palpitations.  Gastrointestinal: Negative for abdominal pain, no bowel changes.  Musculoskeletal: Negative for gait problem or joint swelling.  Skin: Negative for rash.  Neurological: Negative for dizziness or headache.  No other specific complaints in a complete review of systems (except as listed in HPI above).      Objective:    BP 120/68   Pulse 73   Temp 98 F (36.7 C) (Oral)   Resp 18   Ht 5\' 7"  (1.702 m)   Wt 154 lb 1.6 oz (69.9 kg)   SpO2 98%   BMI 24.14 kg/m   Wt Readings from Last 3 Encounters:  06/28/22 154 lb 1.6 oz (69.9 kg)  06/19/22  155 lb (70.3 kg)  06/04/22 154 lb 11.2 oz (70.2 kg)    Physical Exam  Constitutional: Patient appears well-developed and well-nourished.  No distress.  HEENT: head atraumatic, normocephalic, pupils equal and reactive to light, ears Tms clear, neck supple, throat within normal limits Cardiovascular: Normal rate, regular rhythm and normal heart sounds.  No murmur heard. No BLE edema. Pulmonary/Chest: Effort normal and breath sounds rhonchi. No respiratory distress. Abdominal: Soft.  There is no tenderness. Psychiatric: Patient has a normal mood and affect. behavior is normal. Judgment and thought content normal.  Assessment & Plan:   Problem List Items Addressed This Visit       Respiratory   Mild intermittent asthma without complication    Patient has been using his inhaler to help with the cough but it has not.  Recommend starting steroid taper and azithromycin.       Relevant Medications   predniSONE (STERAPRED UNI-PAK 21 TAB) 10 MG (21) TBPK tablet   Other Visit Diagnoses     Persistent cough    -  Primary   Push fluids and get rest.  Continue taking Allegra, Mucinex and Zyrtec.  Start steroid taper, azithromycin, Tessalon Perles and promethazine DM.   Relevant Medications   predniSONE (STERAPRED UNI-PAK 21 TAB) 10 MG (21) TBPK tablet   benzonatate (TESSALON) 100 MG capsule  promethazine-dextromethorphan (PROMETHAZINE-DM) 6.25-15 MG/5ML syrup   azithromycin (ZITHROMAX) 250 MG tablet   Viral upper respiratory tract infection       Push fluids and get rest. Continue taking Allegra, Mucinex and Zyrtec. Start steroid taper, azithromycin, Tessalon Perles and promethazine DM.   Relevant Medications   predniSONE (STERAPRED UNI-PAK 21 TAB) 10 MG (21) TBPK tablet   benzonatate (TESSALON) 100 MG capsule   promethazine-dextromethorphan (PROMETHAZINE-DM) 6.25-15 MG/5ML syrup   azithromycin (ZITHROMAX) 250 MG tablet        Follow up plan: Return if symptoms worsen or fail to  improve.

## 2022-06-28 NOTE — Progress Notes (Deleted)
   There were no vitals taken for this visit.   Subjective:    Patient ID: Mike Peters, male    DOB: November 09, 1990, 31 y.o.   MRN: [id number]  HPI: Mike Peters is a 31 y.o. male  No chief complaint on file.   Relevant past medical, surgical, family and social history reviewed and updated as indicated. Interim medical history since our last visit reviewed. Allergies and medications reviewed and updated.  Review of Systems  Constitutional: Negative for fever or weight change.  Respiratory: Negative for cough and shortness of breath.   Cardiovascular: Negative for chest pain or palpitations.  Gastrointestinal: Negative for abdominal pain, no bowel changes.  Musculoskeletal: Negative for gait problem or joint swelling.  Skin: Negative for rash.  Neurological: Negative for dizziness or headache.  No other specific complaints in a complete review of systems (except as listed in HPI above).      Objective:    There were no vitals taken for this visit.  Wt Readings from Last 3 Encounters:  06/19/22 155 lb (70.3 kg)  06/04/22 154 lb 11.2 oz (70.2 kg)  04/29/22 158 lb 9.6 oz (71.9 kg)    Physical Exam  Constitutional: Patient appears well-developed and well-nourished. Obese *** No distress.  HEENT: head atraumatic, normocephalic, pupils equal and reactive to light, ears ***, neck supple, throat within normal limits Cardiovascular: Normal rate, regular rhythm and normal heart sounds.  No murmur heard. No BLE edema. Pulmonary/Chest: Effort normal and breath sounds normal. No respiratory distress. Abdominal: Soft.  There is no tenderness. Psychiatric: Patient has a normal mood and affect. behavior is normal. Judgment and thought content normal.     Assessment & Plan:   Problem List Items Addressed This Visit   None    Follow up plan: No follow-ups on file.

## 2022-06-28 NOTE — Assessment & Plan Note (Signed)
Patient has been using his inhaler to help with the cough but it has not.  Recommend starting steroid taper and azithromycin.

## 2022-07-29 DIAGNOSIS — G4733 Obstructive sleep apnea (adult) (pediatric): Secondary | ICD-10-CM | POA: Diagnosis not present

## 2022-09-03 ENCOUNTER — Telehealth: Payer: Self-pay | Admitting: Adult Health

## 2022-09-03 NOTE — Telephone Encounter (Signed)
[name], can you assist with this? Order was placed to [facility]. Are they able to adjust the settings?

## 2022-09-03 NOTE — Telephone Encounter (Signed)
I called the patient and he stated that he got a Cpap machine from the [facility].  He needed to get the Cpap settings per the order sent to [facility] auto 5-15 cm H20.

## 2022-09-05 NOTE — Telephone Encounter (Signed)
Patient stated that when he used the Cpap machine the first night he had it setting on 5.  I told him the order was placed for auto 5-15 cm H20.   He stated he has it connected to airview and will set the machine to auto 5-15 cm H20.

## 2022-09-24 ENCOUNTER — Ambulatory Visit: Payer: Self-pay | Admitting: *Deleted

## 2022-09-24 ENCOUNTER — Ambulatory Visit (INDEPENDENT_AMBULATORY_CARE_PROVIDER_SITE_OTHER): Payer: 59 | Admitting: Physician Assistant

## 2022-09-24 ENCOUNTER — Emergency Department
Admission: EM | Admit: 2022-09-24 | Discharge: 2022-09-24 | Disposition: A | Discharge status: Home / Self Care | Payer: 59 | Attending: Student in an Organized Health Care Education/Training Program | Admitting: Student in an Organized Health Care Education/Training Program

## 2022-09-24 ENCOUNTER — Other Ambulatory Visit: Payer: Self-pay

## 2022-09-24 ENCOUNTER — Encounter: Payer: Self-pay | Admitting: Physician Assistant

## 2022-09-24 ENCOUNTER — Emergency Department: Payer: 59

## 2022-09-24 VITALS — BP 122/80 | HR 81 | Temp 98.3°F | Resp 16 | Ht 67.0 in | Wt 158.5 lb

## 2022-09-24 DIAGNOSIS — R079 Chest pain, unspecified: Secondary | ICD-10-CM | POA: Insufficient documentation

## 2022-09-24 DIAGNOSIS — J45909 Unspecified asthma, uncomplicated: Secondary | ICD-10-CM | POA: Insufficient documentation

## 2022-09-24 LAB — TROPONIN I (HIGH SENSITIVITY): Troponin I (High Sensitivity): 3 ng/L (ref <18)

## 2022-09-24 LAB — CBC
HCT: 47.3 % (ref 39.0–52.0)
Hemoglobin: 15.1 g/dL (ref 13.0–17.0)
MCH: 26.6 pg (ref 26.0–34.0)
MCHC: 31.9 g/dL (ref 30.0–36.0)
MCV: 83.3 fL (ref 80.0–100.0)
Platelets: 235 10*3/uL (ref 150–400)
RBC: 5.68 MIL/uL (ref 4.22–5.81)
RDW: 12.5 % (ref 11.5–15.5)
WBC: 5.8 10*3/uL (ref 4.0–10.5)
nRBC: 0 % (ref 0.0–0.2)

## 2022-09-24 LAB — BASIC METABOLIC PANEL
Anion gap: 8 (ref 5–15)
BUN: 15 mg/dL (ref 6–20)
CO2: 30 mmol/L (ref 22–32)
Calcium: 9.5 mg/dL (ref 8.9–10.3)
Chloride: 101 mmol/L (ref 98–111)
Creatinine, Ser: 0.92 mg/dL (ref 0.61–1.24)
GFR, Estimated: >60 mL/min (ref >60)
Glucose, Bld: 102 mg/dL — ABNORMAL HIGH (ref 70–99)
Potassium: 4 mmol/L (ref 3.5–5.1)
Sodium: 139 mmol/L (ref 135–145)

## 2022-09-24 NOTE — Telephone Encounter (Signed)
  Chief Complaint: chest pain with parallel back pain requesting appt  Symptoms: left side chest pain above left nipple  pain in back , reports "feels like parallel pain from chest to back. Lightheaded today  but now gone. Worse chest pain at night when laying down Frequency: 1 week  Pertinent Negatives: Patient denies difficulty breathing no dizziness. No sweating no vomiting  Disposition: [x] ED /[] Urgent Care (no appt availability in office) / [] Appointment(In office/virtual)/ []  [facility]/ [] Home Care/ [] Refused Recommended Disposition /[] [facility]/ []  Follow-up with PCP Additional Notes:   Recommended ED. Patient declined and would like to see PCP tomorrow. Reports he is not in [place] at this time and noted lightheadedness today and wants appt. Requesting a call back. Reports if sx worsen he will go to ED.      Reason for Disposition  [1] Chest pain lasts > 5 minutes AND [2] occurred in past 3 days (72 hours) (Exception: Feels exactly the same as previously diagnosed heartburn and has accompanying sour taste in mouth.)  Answer Assessment - Initial Assessment Questions 1. LOCATION: "Where does it hurt?"       Left side above left nipple  2. RADIATION: "Does the pain go anywhere else?" (e.g., into neck, jaw, arms, back)    Back  3. ONSET: "When did the chest pain begin?" (Minutes, hours or days)      1 week  4. PATTERN: "Does the pain come and go, or has it been constant since it started?"  "Does it get worse with exertion?"      Constant . Same with exertion and sitting  5. DURATION: "How long does it last" (e.g., seconds, minutes, hours)     Constant  6. SEVERITY: "How bad is the pain?"  (e.g., Scale 1-10; mild, moderate, or severe)    - MILD (1-3): doesn't interfere with normal activities     - MODERATE (4-7): interferes with normal activities or awakens from sleep    - SEVERE (8-10): excruciating pain, unable to do any normal activities      Mild   7. CARDIAC RISK FACTORS: "Do you have any history of heart problems or risk factors for heart disease?" (e.g., angina, prior heart attack; diabetes, high blood pressure, high cholesterol, smoker, or strong family history of heart disease)  As a Baby had a murmur  8. PULMONARY RISK FACTORS: "Do you have any history of lung disease?"  (e.g., blood clots in lung, asthma, emphysema, birth control pills)     na 9. CAUSE: "What do you think is causing the chest pain?"     Not sure  10. OTHER SYMPTOMS: "Do you have any other symptoms?" (e.g., dizziness, nausea, vomiting, sweating, fever, difficulty breathing, cough)       Lightheaded  11. PREGNANCY: "Is there any chance you are pregnant?" "When was your last menstrual period?"       na  Protocols used: Chest Pain-A-AH

## 2022-09-24 NOTE — Progress Notes (Signed)
Acute Office Visit   Patient: Mike Peters   DOB: July 31, 1991   32 y.o. Male  MRN: [id number] Visit Date: 09/24/2022  Today's healthcare provider: Dani Gobble Saraiyah Hemminger, PA-C  Introduced myself to the patient as a Journalist, newspaper and provided education on APPs in clinical practice.    Chief Complaint  Patient presents with   Chest Pain    Onset for 2 weeks pain remains knows it is bothering him on left side and radiates through the back   Back Pain   Subjective    HPI HPI     Chest Pain    Additional comments: Onset for 2 weeks pain remains knows it is bothering him on left side and radiates through the back      Last edited by Salomon Fick, CMA on 09/24/2022  3:43 PM.         Chest pain   Onset: sudden  Duration: about 2 weeks  Location: Left side of chest and through to back, reports it is constant  Associated symptoms: reports some lightheadedness today for a few seconds, reports some tachycardia today while he was standing in group meeting  Pain level and character: 1/10 feels like someone is prodding the area really had  Recurrent: denies previous similar chest pain  Exertion dependent: does not worsen with exertion  SOB/DOE: denies SOB, DOE  Reports he does struggle with sleep- states he has a CPAP that he is still getting accustomed to - he sees Sleep medicine for CPAP  Denies recent illnesses or sickness  Interventions: nothing   Alleviating: nothing  Aggravating: leaning onto right arm   Recent injuries or trauma to area:denies recent exercise or strenuous activity to area    Medications: Outpatient Medications Prior to Visit  Medication Sig   benzonatate (TESSALON) 100 MG capsule Take 2 capsules (200 mg total) by mouth 2 (two) times daily as needed for cough. (Patient not taking: Reported on 09/24/2022)   predniSONE (STERAPRED UNI-PAK 21 TAB) 10 MG (21) TBPK tablet Take as directed on package.  (60 mg po on day 1, 50 mg po on day 2...) (Patient not  taking: Reported on 09/24/2022)   promethazine-dextromethorphan (PROMETHAZINE-DM) 6.25-15 MG/5ML syrup Take 5 mLs by mouth at bedtime as needed for cough. (Patient not taking: Reported on 09/24/2022)   No facility-administered medications prior to visit.    Review of Systems  Respiratory:  Negative for chest tightness and shortness of breath.   Cardiovascular:  Positive for chest pain. Negative for palpitations and leg swelling.       Objective    BP 122/80   Pulse 81   Temp 98.3 F (36.8 C) (Oral)   Resp 16   Ht 5\' 7"  (1.702 m)   Wt 158 lb 8 oz (71.9 kg)   SpO2 98%   BMI 24.82 kg/m    Physical Exam Vitals reviewed.  Constitutional:      General: He is awake.     Appearance: Normal appearance. He is well-developed and well-groomed.  HENT:     Head: Normocephalic and atraumatic.  Eyes:     General: Lids are normal. Gaze aligned appropriately.     Extraocular Movements: Extraocular movements intact.     Conjunctiva/sclera: Conjunctivae normal.  Cardiovascular:     Rate and Rhythm: Normal rate and regular rhythm.     Pulses: Normal pulses.     Heart sounds: Normal heart sounds. No murmur heard.    No friction  rub. No gallop.  Pulmonary:     Effort: Pulmonary effort is normal.     Breath sounds: Normal breath sounds. No decreased air movement. No decreased breath sounds, wheezing, rhonchi or rales.  Musculoskeletal:     Cervical back: Normal range of motion and neck supple.     Right lower leg: No edema.     Left lower leg: No edema.  Skin:    General: Skin is warm and dry.     Capillary Refill: Capillary refill takes less than 2 seconds.  Neurological:     General: No focal deficit present.     Mental Status: He is alert and oriented to person, place, and time. Mental status is at baseline.  Psychiatric:        Mood and Affect: Mood normal.        Behavior: Behavior normal. Behavior is cooperative.        Thought Content: Thought content normal.        Judgment:  Judgment normal.     EKG performed today in office EKG demonstrates normal sinus rhythm with ST elevations in V1, V2, V3 , rate of ~68 bpm Today's EKG was compared to previous EKG from 02/18/2013 and there are similar elevations on this EKG as well    No results found for any visits on 09/24/22.  Assessment & Plan      No follow-ups on file.      Problem List Items Addressed This Visit       Other   Chest pain - Primary    Acute, new concern Reports ongoing, constant 1/10 chest pain for the past 2 weeks  Denies increased pain with exertion, SOB, DOE but reports brief period of dizziness and tachycardia today while standing in group setting EKG today in office was abnormal- reviewed results with patient and had 2 other providers review results as well  Recommend that patient seek ED care to rule out STEMI/ NSTEMI at this time. Offered to call EMS services but patient declined saying he would drive himself.  [facility] ED staff of incoming patient and provided EKG copy to him to take to ED  Reviewed chest pain precautions with him should this happen in the future- recommend prompt EMS call and aspirin while waiting.  Given previous similar EKG results- will provide Cardiology referral as well  Follow up as needed        Relevant Orders   EKG 12-Lead   COMPLETE METABOLIC PANEL WITH GFR   CBC w/Diff/Platelet   TSH   Ambulatory referral to Cardiology     No follow-ups on file.   I, Jahnavi Muratore E Sharmaine Bain, PA-C, have reviewed all documentation for this visit. The documentation on 09/24/22 for the exam, diagnosis, procedures, and orders are all accurate and complete.   Talitha Givens, MHS, PA-C [facility]

## 2022-09-24 NOTE — ED Provider Notes (Signed)
[name] Provider Note    Event Date/Time   First MD Initiated Contact with Patient 09/24/22 1816     (approximate)   History   Chief Complaint Chest Pain   HPI Mike Peters is a 32 y.o. male, history of GERD, hyperlipidemia, asthma, presents to the emergency department for evaluation of left-sided chest pain x 1.5 weeks.  He describes as a constant, dull pain around the left side of his chest.  Does not appear to radiate anywhere else.  He states that nothing makes it better or worse.  Denies fever/chills, shortness of breath, abdominal pain, flank pain, nausea/vomiting, diarrhea, urinary symptoms, headache, weakness, paresthesias, dizziness/lightheadedness, or rashes/lesions.  History Limitations: No limitations.        Physical Exam  Triage Vital Signs: ED Triage Vitals  Enc Vitals Group     BP 09/24/22 1636 (!) 150/99     Pulse Rate 09/24/22 1636 66     Resp 09/24/22 1636 17     Temp 09/24/22 1636 98.3 F (36.8 C)     Temp Source 09/24/22 1636 Oral     SpO2 09/24/22 1636 98 %     Weight --      Height --      Head Circumference --      Peak Flow --      Pain Score 09/24/22 1637 1     Pain Loc --      Pain Edu? --      Excl. in [place]? --     Most recent vital signs: Vitals:   09/24/22 1636  BP: (!) 150/99  Pulse: 66  Resp: 17  Temp: 98.3 F (36.8 C)  SpO2: 98%    General: Awake, NAD.  Skin: Warm, dry. No rashes or lesions.  Eyes: PERRL. Conjunctivae normal.  CV: Good peripheral perfusion.  S1 and S2 present.  No murmurs, rubs, or gallops.  No chest wall tenderness.  No overlying warmth or erythema. Resp: Normal effort.  Lung sounds clear bilaterally. Abd: Soft, non-tender. No distention.  Neuro: At baseline. No gross neurological deficits.  Musculoskeletal: Normal ROM of all extremities.   Physical Exam    ED Results / Procedures / Treatments  Labs (all labs ordered are listed, but only abnormal results are  displayed) Labs Reviewed  BASIC METABOLIC PANEL - Abnormal; Notable for the following components:      Result Value   Glucose, Bld 102 (*)    All other components within normal limits  CBC  TROPONIN I (HIGH SENSITIVITY)  TROPONIN I (HIGH SENSITIVITY)     EKG Sinus rhythm, rate of 67, ST segment elevation in V3-V5, consistent with prior EKG back in 2014.  Normal QRS.  No QT prolongation.  No significant axis deviations.   RADIOLOGY  ED Provider Interpretation: I personally viewed and interpreted this x-ray, no evidence of acute cardiopulmonary abnormalities.  DG Chest 2 View  Result Date: 09/24/2022 CLINICAL DATA:  Chest pain EXAM: CHEST - 2 VIEW COMPARISON:  Chest radiograph dated February 18, 2013 FINDINGS: The heart size and mediastinal contours are within normal limits. Both lungs are clear. The visualized skeletal structures are unremarkable. IMPRESSION: No active cardiopulmonary disease. Electronically Signed   By: Keane Police D.O.   On: 09/24/2022 17:44    PROCEDURES:  Critical Care performed: N/A.  Procedures    MEDICATIONS ORDERED IN ED: Medications - No data to display   IMPRESSION / MDM / [place] / ED COURSE  I  reviewed the triage vital signs and the nursing notes.                              Differential diagnosis includes, but is not limited to, ACS, pericarditis, myocarditis, pneumonia, costochondritis, intercostal muscle strain, GERD.  ED Course Patient appears well, vitals within normal limits.  NAD.  CBC shows no cytosis or anemia.  BMP shows no electrolyte abnormalities or AKI.  Initial troponin unremarkable at 3.  Assessment/Plan Patient presents with left-sided chest pain x 1.5 weeks.  He appears well clinically.  No remarkable findings on physical examination.  Lab workup is reassuring.  His EKG does show some ST segment elevation in V3-V5, which he states is what concerned the urgent care provider earlier today.  However, this appears  consistent with his EKG back in 2014.  Suspect likely benign early repolarization.  His troponins are reassuring.  X-rays not show any acute abnormalities.  I do not believe he needs CT imaging to evaluate for PE.  He is PERC negative.  Very low suspicion for any serious or life-threatening pathology.  He states that he has a follow-up with cardiology.  Encouraged him to follow through with his follow-up as needed.  Take Tylenol ibuprofen as needed for pain.  He was amenable to this.  Will discharge.  Considered admission for this patient, but given his stable presentation and unremarkable workup, is unlikely benefit from admission.  Provided the patient with anticipatory guidance, return precautions, and educational material. Encouraged the patient to return to the emergency department at any time if they begin to experience any new or worsening symptoms. Patient expressed understanding and agreed with the plan.   Patient's presentation is most consistent with acute complicated illness / injury requiring diagnostic workup.       FINAL CLINICAL IMPRESSION(S) / ED DIAGNOSES   Final diagnoses:  Nonspecific chest pain     Rx / DC Orders   ED Discharge Orders     None        Note:  This document was prepared using Dragon voice recognition software and may include unintentional dictation errors.   Teodoro Spray, [place] 09/24/22 1941    Merlyn Lot, MD 09/24/22 2253

## 2022-09-24 NOTE — Discharge Instructions (Addendum)
-  We are checking what is causing your chest pain at this time, however it does not appear to be anything serious or life-threatening.  Please follow-up with your primary care fighter sometime within the next week for reevaluation.  You may take Tylenol/ibuprofen as needed for pain.  -Please return to the emergency department anytime if you begin to experience any new or worsening symptoms.

## 2022-09-24 NOTE — ED Triage Notes (Signed)
Pt presents to ED with c/o of CP for the past week. Pt denies SOB. Pt states pain stays in L chest with no radiation. NAD noted.   Pt states HX of murmur.

## 2022-09-24 NOTE — Assessment & Plan Note (Addendum)
Acute, new concern Reports ongoing, constant 1/10 chest pain for the past 2 weeks  Denies increased pain with exertion, SOB, DOE but reports brief period of dizziness and tachycardia today while standing in group setting EKG today in office was abnormal- reviewed results with patient and had 2 other providers review results as well  Recommend that patient seek ED care to rule out STEMI/ NSTEMI at this time. Offered to call EMS services but patient declined saying he would drive himself.  [place] [facility] ED staff of incoming patient and provided EKG copy to him to take to ED  Reviewed chest pain precautions with him should this happen in the future- recommend prompt EMS call and aspirin while waiting.  Given previous similar EKG results- will provide Cardiology referral as well  Follow up as needed

## 2022-09-24 NOTE — Telephone Encounter (Signed)
Lvm for pt to call back and schedule an appt 

## 2022-09-24 NOTE — ED Notes (Signed)
The pt was placed on the cardiac monitor.

## 2022-09-25 LAB — CBC WITH DIFFERENTIAL/PLATELET
Absolute Monocytes: 519 cells/uL (ref 200–950)
Basophils Absolute: 21 cells/uL (ref 0–200)
Basophils Relative: 0.4 %
Eosinophils Absolute: 133 cells/uL (ref 15–500)
Eosinophils Relative: 2.5 %
HCT: 45.5 % (ref 38.5–50.0)
Hemoglobin: 14.8 g/dL (ref 13.2–17.1)
Lymphs Abs: 2237 cells/uL (ref 850–3900)
MCH: 26.6 pg — ABNORMAL LOW (ref 27.0–33.0)
MCHC: 32.5 g/dL (ref 32.0–36.0)
MCV: 81.8 fL (ref 80.0–100.0)
MPV: 11.4 fL (ref 7.5–12.5)
Monocytes Relative: 9.8 %
Neutro Abs: 2390 cells/uL (ref 1500–7800)
Neutrophils Relative %: 45.1 %
Platelets: 238 10*3/uL (ref 140–400)
RBC: 5.56 10*6/uL (ref 4.20–5.80)
RDW: 12.3 % (ref 11.0–15.0)
Total Lymphocyte: 42.2 %
WBC: 5.3 10*3/uL (ref 3.8–10.8)

## 2022-09-25 LAB — COMPLETE METABOLIC PANEL WITH GFR
AG Ratio: 1.7 (calc) (ref 1.0–2.5)
ALT: 20 U/L (ref 9–46)
AST: 22 U/L (ref 10–40)
Albumin: 4.7 g/dL (ref 3.6–5.1)
Alkaline phosphatase (APISO): 70 U/L (ref 36–130)
BUN: 14 mg/dL (ref 7–25)
CO2: 31 mmol/L (ref 20–32)
Calcium: 9.9 mg/dL (ref 8.6–10.3)
Chloride: 102 mmol/L (ref 98–110)
Creat: 0.95 mg/dL (ref 0.60–1.26)
Globulin: 2.7 g/dL (calc) (ref 1.9–3.7)
Glucose, Bld: 107 mg/dL — ABNORMAL HIGH (ref 65–99)
Potassium: 4.1 mmol/L (ref 3.5–5.3)
Sodium: 140 mmol/L (ref 135–146)
Total Bilirubin: 0.6 mg/dL (ref 0.2–1.2)
Total Protein: 7.4 g/dL (ref 6.1–8.1)
eGFR: 110 mL/min/{1.73_m2} (ref >=60)

## 2022-09-25 LAB — TSH: TSH: 6.32 mIU/L — ABNORMAL HIGH (ref 0.40–4.50)

## 2022-09-26 NOTE — Progress Notes (Signed)
Your electrolytes, liver and kidney function appear to be stable and in normal ranges Your blood count was overall normal - no signs of anemia or elevated white cells. Your thyroid was elevated. Sometimes this can cause palpitations and some of the symptoms you were concerned about. I think we should schedule a follow up after you see Cardiology so we can recheck your thyroid function and review the findings from your Cardiology apt. Let us know if you have further questions.

## 2022-12-17 ENCOUNTER — Encounter: Payer: Self-pay | Admitting: Cardiology

## 2022-12-17 ENCOUNTER — Ambulatory Visit: Payer: 59 | Attending: Cardiology | Admitting: Cardiology

## 2022-12-17 VITALS — BP 110/62 | HR 59 | Ht 67.0 in | Wt 162.2 lb

## 2022-12-17 DIAGNOSIS — R072 Precordial pain: Secondary | ICD-10-CM | POA: Diagnosis not present

## 2022-12-17 DIAGNOSIS — R079 Chest pain, unspecified: Secondary | ICD-10-CM | POA: Diagnosis not present

## 2022-12-17 DIAGNOSIS — R9431 Abnormal electrocardiogram [ECG] [EKG]: Secondary | ICD-10-CM

## 2022-12-17 NOTE — Patient Instructions (Signed)
Medication Instructions:   Your physician recommends that you continue on your current medications as directed. Please refer to the Current Medication list given to you today.  *If you need a refill on your cardiac medications before your next appointment, please call your pharmacy*   Lab Work:  None Ordered  If you have labs (blood work) drawn today and your tests are completely normal, you will receive your results only by: MyChart Message (if you have MyChart) OR A paper copy in the mail If you have any lab test that is abnormal or we need to change your treatment, we will call you to review the results.   Testing/Procedures:  Your physician has requested that you have an echocardiogram. Echocardiography is a painless test that uses sound waves to create images of your heart. It provides your doctor with information about the size and shape of your heart and how well your heart's chambers and valves are working. This procedure takes approximately one hour. There are no restrictions for this procedure. Please do NOT wear cologne, perfume, aftershave, or lotions (deodorant is allowed). Please arrive 15 minutes prior to your appointment time.    Your cardiac CT will be scheduled at   [facility] [address] [place], [place] [address] [phone]  If scheduled at [facility], please arrive 15 mins early for check-in and test prep.  Please follow these instructions carefully (unless otherwise directed):  On the Night Before the Test: Be sure to Drink plenty of water. Do not consume any caffeinated/decaffeinated beverages or chocolate 12 hours prior to your test. Do not take any antihistamines 12 hours prior to your test.  On the Day of the Test: Drink plenty of water until 1 hour prior to the test. Do not eat any food 1 hour prior to test. You may take your regular medications prior to the test.    After the Test: Drink plenty of water. After receiving IV contrast, you may experience a mild flushed feeling. This is normal. On occasion, you may experience a mild rash up to 24 hours after the test. This is not dangerous. If this occurs, you can take Benadryl 25 mg and increase your fluid intake. If you experience trouble breathing, this can be serious. If it is severe call 911 IMMEDIATELY. If it is mild, please call our office.  We will call to schedule your test 2-4 weeks out understanding that some insurance companies will need an authorization prior to the service being performed.   For non-scheduling related questions, please contact the cardiac imaging nurse navigator should you have any questions/concerns: Rockwell Alexandria, Cardiac Imaging Nurse Navigator Larey Brick, Cardiac Imaging Nurse Navigator [facility] Direct Office Dial: [phone]   For scheduling needs, including cancellations and rescheduling, please call [place], [phone].   Follow-Up: At [facility], you and your health needs are our priority.  As part of our continuing mission to provide you with exceptional heart care, we have created designated Provider Care Teams.  These Care Teams include your primary Cardiologist (physician) and Advanced Practice Providers (APPs -  Physician Assistants and Nurse Practitioners) who all work together to provide you with the care you need, when you need it.  We recommend signing up for the patient portal called "MyChart".  Sign up information is provided on this After Visit Summary.  MyChart is used to connect with patients for Virtual Visits (Telemedicine).  Patients are able to  view lab/test results, encounter notes, upcoming appointments, etc.  Non-urgent messages can be sent to your provider as well.   To learn more about what you can do with MyChart, go to [email or web address].    Your next appointment:   2 - 3  month(s)  Provider:   You may see Debbe Odea, MD or one of the following Advanced Practice Providers on your designated Care Team:   Nicolasa Ducking, NP Eula Listen, PA-C Cadence Fransico Michael, PA-C Charlsie Quest, NP

## 2022-12-17 NOTE — Progress Notes (Signed)
Cardiology Office Note:    Date:  12/17/2022   ID:  Mike Peters, DOB 1991/04/23, MRN [id number]  PCP:  Berniece Salines, FNP   [facility] Providers Cardiologist:  Debbe Odea, MD     Referring MD: Providence Crosby, PA-C   Chief Complaint  Patient presents with   New Patient (Initial Visit)    Chest pain no complaints today. Meds reviewed verbally with pt.   Mike Peters is a 32 y.o. male who is being seen today for the evaluation of chest pain at the request of Mecum, Erin E, PA-C.   History of Present Illness:    Mike Peters is a 32 y.o. male with a hx of GERD who presents with chest pain.  States having symptoms of chest pain ongoing for about 2 weeks.  Saw primary care provider who obtained EKG and noted to be abnormal.  Was advised to go to the emergency room.  Evaluated in the ED 2 months ago, EKG was abnormal but unchanged from prior, troponin is normal.  He states symptoms have improved over the past week or so.  Denies any history of heart disease, denies smoking, denies any family history of heart disease.  Past Medical History:  Diagnosis Date   Asthma    Murmur     Past Surgical History:  Procedure Laterality Date   ESOPHAGOGASTRODUODENOSCOPY (EGD) WITH PROPOFOL N/A 02/07/2020   Procedure: ESOPHAGOGASTRODUODENOSCOPY (EGD) WITH PROPOFOL;  Surgeon: Toney Reil, MD;  Location: [facility] ENDOSCOPY;  Service: Gastroenterology;  Laterality: N/A;   NO PAST SURGERIES      Current Medications: No outpatient medications have been marked as taking for the 12/17/22 encounter (Office Visit) with Debbe Odea, MD.     Allergies:   Patient has no known allergies.   Social History   Socioeconomic History   Marital status: Single    Spouse name: Not on file   Number of children: Not on file   Years of education: Not on file   Highest education level: Not on file  Occupational History   Not on file  Tobacco Use   Smoking status: Never     Passive exposure: Past   Smokeless tobacco: Never  Vaping Use   Vaping Use: Never used  Substance and Sexual Activity   Alcohol use: Never   Drug use: Never   Sexual activity: Yes    Partners: Female    Birth control/protection: Condom  Other Topics Concern   Not on file  Social History Narrative   Lives with mom and brother - also has a Orthoptist and chihuahua    Social Determinants of Health   Financial Resource Strain: Low Risk  (01/30/2021)   Overall Financial Resource Strain (CARDIA)    Difficulty of Paying Living Expenses: Not hard at all  Food Insecurity: No Food Insecurity (01/30/2021)   Hunger Vital Sign    Worried About Running Out of Food in the Last Year: Never true    Ran Out of Food in the Last Year: Never true  Transportation Needs: No Transportation Needs (01/30/2021)   PRAPARE - Administrator, Civil Service (Medical): No    Lack of Transportation (Non-Medical): No  Physical Activity: Insufficiently Active (01/30/2021)   Exercise Vital Sign    Days of Exercise per Week: 2 days    Minutes of Exercise per Session: 20 min  Stress: No Stress Concern Present (01/30/2021)   [employer] of Occupational Health - Occupational Stress Questionnaire  Feeling of Stress : Only a little  Social Connections: Moderately Isolated (01/30/2021)   Social Connection and Isolation Panel [NHANES]    Frequency of Communication with Friends and Family: More than three times a week    Frequency of Social Gatherings with Friends and Family: More than three times a week    Attends Religious Services: 1 to 4 times per year    Active Member of [employer] or Organizations: No    Attends Banker Meetings: Never    Marital Status: Never married     Family History: The patient's family history includes Diabetes in his mother.  ROS:   Please see the history of present illness.     All other systems reviewed and are negative.  EKGs/Labs/Other Studies Reviewed:     The following studies were reviewed today:   EKG:  EKG is  ordered today.  The ekg ordered today demonstrates sinus rhythm/sinus bradycardia, heart rate 59,, anterior ST elevations, unchanged from prior.  Recent Labs: 09/24/2022: ALT 20; BUN 15; Creatinine, Ser 0.92; Hemoglobin 15.1; Platelets 235; Potassium 4.0; Sodium 139; TSH 6.32  Recent Lipid Panel    Component Value Date/Time   CHOL 191 01/30/2021 1041   TRIG 74 01/30/2021 1041   HDL 56 01/30/2021 1041   CHOLHDL 3.4 01/30/2021 1041   LDLCALC 118 (H) 01/30/2021 1041     Risk Assessment/Calculations:             Physical Exam:    VS:  BP 110/62 (BP Location: Right Arm, Patient Position: Sitting, Cuff Size: Normal)   Pulse (!) 59   Ht 5\' 7"  (1.702 m)   Wt 162 lb 4 oz (73.6 kg)   SpO2 98%   BMI 25.41 kg/m     Wt Readings from Last 3 Encounters:  12/17/22 162 lb 4 oz (73.6 kg)  09/24/22 158 lb 8 oz (71.9 kg)  06/28/22 154 lb 1.6 oz (69.9 kg)     GEN:  Well nourished, well developed in no acute distress HEENT: Normal NECK: No JVD; No carotid bruits CARDIAC: RRR, no murmurs, rubs, gallops RESPIRATORY:  Clear to auscultation without rales, wheezing or rhonchi  ABDOMEN: Soft, non-tender, non-distended MUSCULOSKELETAL:  No edema; No deformity  SKIN: Warm and dry NEUROLOGIC:  Alert and oriented x 3 PSYCHIATRIC:  Normal affect   ASSESSMENT:    1. Abnormal EKG   2. Precordial pain   3. Chest pain, unspecified type    PLAN:    In order of problems listed above:  Chest pain, persistent anterior ST elevations on EKG.  Get echocardiogram, get coronary CTA to rule out any underlying cardiovascular disease.  Denies any previous history of heart attacks.  Follow-up after cardiac testing.      Medication Adjustments/Labs and Tests Ordered: Current medicines are reviewed at length with the patient today.  Concerns regarding medicines are outlined above.  Orders Placed This Encounter  Procedures   CT CORONARY  MORPH W/CTA COR W/SCORE W/CA W/CM &/OR WO/CM   EKG 12-Lead   ECHOCARDIOGRAM COMPLETE   No orders of the defined types were placed in this encounter.   Patient Instructions  Medication Instructions:   Your physician recommends that you continue on your current medications as directed. Please refer to the Current Medication list given to you today.  *If you need a refill on your cardiac medications before your next appointment, please call your pharmacy*   Lab Work:  None Ordered  If you have labs (blood work) drawn  today and your tests are completely normal, you will receive your results only by: MyChart Message (if you have MyChart) OR A paper copy in the mail If you have any lab test that is abnormal or we need to change your treatment, we will call you to review the results.   Testing/Procedures:  Your physician has requested that you have an echocardiogram. Echocardiography is a painless test that uses sound waves to create images of your heart. It provides your doctor with information about the size and shape of your heart and how well your heart's chambers and valves are working. This procedure takes approximately one hour. There are no restrictions for this procedure. Please do NOT wear cologne, perfume, aftershave, or lotions (deodorant is allowed). Please arrive 15 minutes prior to your appointment time.    Your cardiac CT will be scheduled at   [name] [address] [place], [place] [address] [phone]  If scheduled at [facility], please arrive 15 mins early for check-in and test prep.  Please follow these instructions carefully (unless otherwise directed):  On the Night Before the Test: Be sure to Drink plenty of water. Do not consume any caffeinated/decaffeinated beverages or chocolate 12 hours prior to your test. Do not take any antihistamines 12 hours prior to your test.  On  the Day of the Test: Drink plenty of water until 1 hour prior to the test. Do not eat any food 1 hour prior to test. You may take your regular medications prior to the test.   After the Test: Drink plenty of water. After receiving IV contrast, you may experience a mild flushed feeling. This is normal. On occasion, you may experience a mild rash up to 24 hours after the test. This is not dangerous. If this occurs, you can take Benadryl 25 mg and increase your fluid intake. If you experience trouble breathing, this can be serious. If it is severe call 911 IMMEDIATELY. If it is mild, please call our office.  We will call to schedule your test 2-4 weeks out understanding that some insurance companies will need an authorization prior to the service being performed.   For non-scheduling related questions, please contact the cardiac imaging nurse navigator should you have any questions/concerns: Rockwell [place], Cardiac Imaging Nurse Navigator Larey Brick, Cardiac Imaging Nurse Navigator [facility] Direct Office Dial: [phone]   For scheduling needs, including cancellations and rescheduling, please call [place], [phone].   Follow-Up: At [facility], you and your health needs are our priority.  As part of our continuing mission to provide you with exceptional heart care, we have created designated Provider Care Teams.  These Care Teams include your primary Cardiologist (physician) and Advanced Practice Providers (APPs -  Physician Assistants and Nurse Practitioners) who all work together to provide you with the care you need, when you need it.  We recommend signing up for the patient portal called "MyChart".  Sign up information is provided on this After Visit Summary.  MyChart is used to connect with patients for Virtual Visits (Telemedicine).  Patients are able to view lab/test results, encounter notes, upcoming appointments, etc.  Non-urgent  messages can be sent to your provider as well.   To learn more about what you can do with MyChart, go to [email or web address].    Your next appointment:   2 - 3 month(s)  Provider:   You may see Debbe Odea, MD or one of  the following Advanced Practice Providers on your designated Care Team:   Nicolasa Ducking, NP Eula Listen, PA-C Cadence Fransico Michael, PA-C Charlsie Quest, NP    Signed, Debbe Odea, MD  12/17/2022 9:32 AM    [facility]

## 2022-12-18 ENCOUNTER — Ambulatory Visit: Payer: 59 | Attending: Cardiology

## 2022-12-18 DIAGNOSIS — R9431 Abnormal electrocardiogram [ECG] [EKG]: Secondary | ICD-10-CM

## 2022-12-18 DIAGNOSIS — R079 Chest pain, unspecified: Secondary | ICD-10-CM | POA: Diagnosis not present

## 2022-12-18 DIAGNOSIS — R072 Precordial pain: Secondary | ICD-10-CM

## 2022-12-18 LAB — ECHOCARDIOGRAM COMPLETE
AR max vel: 3.15 cm2
AV Area VTI: 3.16 cm2
AV Area mean vel: 3.11 cm2
AV Mean grad: 2 mmHg
AV Peak grad: 3.7 mmHg
Ao pk vel: 0.96 m/s
Area-P 1/2: 3.34 cm2
Calc EF: 52 %
S' Lateral: 3.1 cm
Single Plane A2C EF: 52.5 %
Single Plane A4C EF: 53.5 %

## 2023-01-10 ENCOUNTER — Encounter (HOSPITAL_COMMUNITY): Payer: Self-pay

## 2023-02-17 ENCOUNTER — Ambulatory Visit: Payer: 59 | Admitting: Cardiology

## 2024-01-14 DIAGNOSIS — J069 Acute upper respiratory infection, unspecified: Secondary | ICD-10-CM | POA: Diagnosis not present

## 2024-01-14 DIAGNOSIS — R0981 Nasal congestion: Secondary | ICD-10-CM | POA: Diagnosis not present

## 2024-01-14 DIAGNOSIS — R509 Fever, unspecified: Secondary | ICD-10-CM | POA: Diagnosis not present

## 2024-01-16 ENCOUNTER — Ambulatory Visit: Payer: Self-pay

## 2024-01-16 ENCOUNTER — Telehealth: Admitting: Physician Assistant

## 2024-01-16 DIAGNOSIS — F320 Major depressive disorder, single episode, mild: Secondary | ICD-10-CM

## 2024-01-16 DIAGNOSIS — B9689 Other specified bacterial agents as the cause of diseases classified elsewhere: Secondary | ICD-10-CM

## 2024-01-16 DIAGNOSIS — J208 Acute bronchitis due to other specified organisms: Secondary | ICD-10-CM | POA: Diagnosis not present

## 2024-01-16 MED ORDER — PSEUDOEPH-BROMPHEN-DM 30-2-10 MG/5ML PO SYRP: 5.0000 mL | ORAL_SOLUTION | Freq: Four times a day (QID) | ORAL | 0 refills | Status: DC | PRN

## 2024-01-16 MED ORDER — ALBUTEROL SULFATE HFA 108 (90 BASE) MCG/ACT IN AERS: 1.0000 | INHALATION_SPRAY | Freq: Four times a day (QID) | RESPIRATORY_TRACT | 0 refills | Status: DC | PRN

## 2024-01-16 MED ORDER — SERTRALINE HCL 50 MG PO TABS: 50.0000 mg | ORAL_TABLET | Freq: Every day | ORAL | 3 refills | Status: DC

## 2024-01-16 MED ORDER — AZITHROMYCIN 250 MG PO TABS: ORAL_TABLET | ORAL | 0 refills | Status: AC

## 2024-01-16 MED ORDER — FLUTICASONE PROPIONATE 50 MCG/ACT NA SUSP: 2.0000 | Freq: Every day | NASAL | 0 refills | Status: AC

## 2024-01-16 NOTE — Patient Instructions (Signed)
 Marin Shutters, thank you for joining Angelia Kelp, PA-C for today's virtual visit.  While this provider is not your primary care provider (PCP), if your PCP is located in our provider database this encounter information will be shared with them immediately following your visit.   A [facility] MyChart account gives you access to today's visit and all your visits, tests, and labs performed at [facility] " click here if you don't have a [facility] MyChart account or go to [email or web address]  Consent: (Patient) Nathyn Luiz provided verbal consent for this virtual visit at the beginning of the encounter.  Current Medications:  Current Outpatient Medications:    albuterol  (VENTOLIN  HFA) 108 (90 Base) MCG/ACT inhaler, Inhale 1-2 puffs into the lungs every 6 (six) hours as needed., Disp: 8 g, Rfl: 0   azithromycin  (ZITHROMAX ) 250 MG tablet, Take 2 tablets on day 1, then 1 tablet daily on days 2 through 5, Disp: 6 tablet, Rfl: 0   brompheniramine-pseudoephedrine-DM 30-2-10 MG/5ML syrup, Take 5 mLs by mouth 4 (four) times daily as needed., Disp: 120 mL, Rfl: 0   fluticasone  (FLONASE ) 50 MCG/ACT nasal spray, Place 2 sprays into both nostrils daily., Disp: 16 g, Rfl: 0   sertraline (ZOLOFT) 50 MG tablet, Take 1 tablet (50 mg total) by mouth at bedtime. Start 0.5 tablet (25mg ) PO at bedtime x 1 week, then increase to 1 tablet (50mg ) po at bedtime, Disp: 30 tablet, Rfl: 3   Medications ordered in this encounter:  Meds ordered this encounter  Medications   azithromycin  (ZITHROMAX ) 250 MG tablet    Sig: Take 2 tablets on day 1, then 1 tablet daily on days 2 through 5    Dispense:  6 tablet    Refill:  0    Supervising Provider:   LAMPTEY, PHILIP O [[id number]]   fluticasone  (FLONASE ) 50 MCG/ACT nasal spray    Sig: Place 2 sprays into both nostrils daily.    Dispense:  16 g    Refill:  0    Supervising Provider:   Corine Dice [[id number]]    brompheniramine-pseudoephedrine-DM 30-2-10 MG/5ML syrup    Sig: Take 5 mLs by mouth 4 (four) times daily as needed.    Dispense:  120 mL    Refill:  0    Supervising Provider:   LAMPTEY, PHILIP O [id number]   albuterol  (VENTOLIN  HFA) 108 (90 Base) MCG/ACT inhaler    Sig: Inhale 1-2 puffs into the lungs every 6 (six) hours as needed.    Dispense:  8 g    Refill:  0    Supervising Provider:   Corine Dice [[id number]]   sertraline (ZOLOFT) 50 MG tablet    Sig: Take 1 tablet (50 mg total) by mouth at bedtime. Start 0.5 tablet (25mg ) PO at bedtime x 1 week, then increase to 1 tablet (50mg ) po at bedtime    Dispense:  30 tablet    Refill:  3    Supervising Provider:   LAMPTEY, PHILIP O [phone]     *If you need refills on other medications prior to your next appointment, please contact your pharmacy*  Follow-Up: Call back or seek an in-person evaluation if the symptoms worsen or if the condition fails to improve as anticipated.  [facility] [phone]  Other Instructions  Managing Anxiety, Adult After being diagnosed with anxiety, you may be relieved to know why you have felt or behaved a certain way. You may also feel overwhelmed about the treatment  ahead and what it will mean for your life. With care and support, you can manage your anxiety. How to manage lifestyle changes Understanding the difference between stress and anxiety Although stress can play a role in anxiety, it is not the same as anxiety. Stress is your body's reaction to life changes and events, both good and bad. Stress is often caused by something external, such as a deadline, test, or competition. It normally goes away after the event has ended and will last just a few hours. But, stress can be ongoing and can lead to more than just stress. Anxiety is caused by something internal, such as imagining a terrible outcome or worrying that something will go wrong that will greatly upset you. Anxiety often  does not go away even after the event is over, and it can become a long-term (chronic) worry. Lowering stress and anxiety Talk with your health care provider or a counselor to learn more about lowering anxiety and stress. They may suggest tension-reduction techniques, such as: Music. Spend time creating or listening to music that you enjoy and that inspires you. Mindfulness-based meditation. Practice being aware of your normal breaths while not trying to control your breathing. It can be done while sitting or walking. Centering prayer. Focus on a word, phrase, or sacred image that means something to you and brings you peace. Deep breathing. Expand your stomach and inhale slowly through your nose. Hold your breath for 3-5 seconds. Then breathe out slowly, letting your stomach muscles relax. Self-talk. Learn to notice and spot thought patterns that lead to anxiety reactions. Change those patterns to thoughts that feel peaceful. Muscle relaxation. Take time to tense muscles and then relax them. Choose a tension-reduction technique that fits your lifestyle and personality. These techniques take time and practice. Set aside 5-15 minutes a day to do them. Specialized therapists can offer counseling and training in these techniques. The training to help with anxiety may be covered by some insurance plans. Other things you can do to manage stress and anxiety include: Keeping a stress diary. This can help you learn what triggers your reaction and then learn ways to manage your response. Thinking about how you react to certain situations. You may not be able to control everything, but you can control your response. Making time for activities that help you relax and not feeling guilty about spending your time in this way. Doing visual imagery. This involves imagining or creating mental pictures to help you relax. Practicing yoga. Through yoga poses, you can lower tension and relax.  Medicines Medicines for  anxiety include: Antidepressant medicines. These are usually prescribed for long-term daily control. Anti-anxiety medicines. These may be added in severe cases, especially when panic attacks occur. When used together, medicines, psychotherapy, and tension-reduction techniques may be the most effective treatment. Relationships Relationships can play a big part in helping you recover. Spend more time connecting with trusted friends and family members. Think about going to couples counseling if you have a partner, taking family education classes, or going to family therapy. Therapy can help you and others better understand your anxiety. How to recognize changes in your anxiety Everyone responds differently to treatment for anxiety. Recovery from anxiety happens when symptoms lessen and stop interfering with your daily life at home or work. This may mean that you will start to: Have better concentration and focus. Worry will interfere less in your daily thinking. Sleep better. Be less irritable. Have more energy. Have improved memory. Try to recognize  when your condition is getting worse. Contact your provider if your symptoms interfere with home or work and you feel like your condition is not improving. Follow these instructions at home: Activity Exercise. Adults should: Exercise for at least 150 minutes each week. The exercise should increase your heart rate and make you sweat (moderate-intensity exercise). Do strengthening exercises at least twice a week. Get the right amount and quality of sleep. Most adults need 7-9 hours of sleep each night. Lifestyle  Eat a healthy diet that includes plenty of vegetables, fruits, whole grains, low-fat dairy products, and lean protein. Do not eat a lot of foods that are high in fats, added sugars, or salt (sodium). Make choices that simplify your life. Do not use any products that contain nicotine or tobacco. These products include cigarettes, chewing  tobacco, and vaping devices, such as e-cigarettes. If you need help quitting, ask your provider. Avoid caffeine, alcohol, and certain over-the-counter cold medicines. These may make you feel worse. Ask your pharmacist which medicines to avoid. General instructions Take over-the-counter and prescription medicines only as told by your provider. Keep all follow-up visits. This is to make sure you are managing your anxiety well or if you need more support. Where to find support You can get help and support from: Self-help groups. Online and [employer]. A trusted spiritual leader. Couples counseling. Family education classes. Family therapy. Where to find more information You may find that joining a support group helps you deal with your anxiety. The following sources can help you find counselors or support groups near you: Mental Health America: mentalhealthamerica.net Anxiety and Depression Association of [place] (ADAA): adaa.org The [employer] on Mental Illness (NAMI): nami.org Contact a health care provider if: You have a hard time staying focused or finishing tasks. You spend many hours a day feeling worried about everyday life. You are very tired because you cannot stop worrying. You start to have headaches or often feel tense. You have chronic nausea or diarrhea. Get help right away if: Your heart feels like it is racing. You have shortness of breath. You have thoughts of hurting yourself or others. Get help right away if you feel like you may hurt yourself or others, or have thoughts about taking your own life. Go to your nearest emergency room or: Call 911. Call the National Suicide Prevention Lifeline at (669) 861-4103 or 988. This is open 24 hours a day. Text the Crisis Text Line at (443)707-6273. This information is not intended to replace advice given to you by your health care provider. Make sure you discuss any questions you have with your health care  provider. Document Revised: 05/14/2022 Document Reviewed: 11/26/2020 Elsevier Patient Education  2024 Elsevier Inc.   If you have been instructed to have an in-person evaluation today at a local Urgent Care facility, please use the link below. It will take you to a list of all of our available [facility], including address, phone number and hours of operation. Please do not delay care.  [facility]  If you or a family member do not have a primary care provider, use the link below to schedule a visit and establish care. When you choose a [facility] primary care physician or advanced practice provider, you gain a long-term partner in health. Find a Primary Care Provider  Learn more about [facility]'s in-office and virtual care options: [facility] - Get Care Now

## 2024-01-16 NOTE — Progress Notes (Signed)
 Virtual Visit Consent   Mike Peters, you are scheduled for a virtual visit with a [facility] provider today. Just as with appointments in the office, your consent must be obtained to participate. Your consent will be active for this visit and any virtual visit you may have with one of our providers in the next 365 days. If you have a MyChart account, a copy of this consent can be sent to you electronically.  As this is a virtual visit, video technology does not allow for your provider to perform a traditional examination. This may limit your provider's ability to fully assess your condition. If your provider identifies any concerns that need to be evaluated in person or the need to arrange testing (such as labs, EKG, etc.), we will make arrangements to do so. Although advances in technology are sophisticated, we cannot ensure that it will always work on either your end or our end. If the connection with a video visit is poor, the visit may have to be switched to a telephone visit. With either a video or telephone visit, we are not always able to ensure that we have a secure connection.  By engaging in this virtual visit, you consent to the provision of healthcare and authorize for your insurance to be billed (if applicable) for the services provided during this visit. Depending on your insurance coverage, you may receive a charge related to this service.  I need to obtain your verbal consent now. Are you willing to proceed with your visit today? Mike Peters has provided verbal consent on 01/16/2024 for a virtual visit (video or telephone). Mike Kelp, PA-C  Date: 01/16/2024 4:49 PM   Virtual Visit via Video Note   I, Mike Peters, connected with  Mike Peters  ([id number], 05/09/1991) on 01/16/24 at  4:00 PM EDT by a video-enabled telemedicine application and verified that I am speaking with the correct person using two identifiers.  Location: Patient: Virtual Visit Location  Patient: Home Provider: Virtual Visit Location Provider: Home Office   I discussed the limitations of evaluation and management by telemedicine and the availability of in person appointments. The patient expressed understanding and agreed to proceed.    History of Present Illness: Mike Peters is a 33 y.o. who identifies as a male who was assigned male at birth, and is being seen today for URI and depression.  HPI: Cough This is a new problem. The current episode started in the past 7 days (6 days, started Saturday 01/10/24). The problem has been gradually worsening. The problem occurs every few minutes. The cough is Productive of sputum and productive of purulent sputum. Associated symptoms include chills, ear congestion (had until Wednesday, now improved), a fever, headaches, nasal congestion, postnasal drip (causes cough), rhinorrhea and sweats (overnight since Tuesday). Pertinent negatives include no ear pain, myalgias, sore throat, shortness of breath or wheezing. The symptoms are aggravated by lying down. Treatments tried: Mucinex , Tylenol, Ibuprofen, albuterol . The treatment provided no relief. His past medical history is significant for asthma (as a kid). There is no history of bronchitis or pneumonia.  Seen at [facility] on Wednesday and tested negative for Covid and Flu. Temp had been 101-102 at Urgent Care  Depression and Anxiety: Reports personal issues triggering depression and anxiety. Having increased worrying thoughts, depressed mood, decreased sleep quality, difficulty falling asleep. Tried Ashwagandha and Valerian Root without improvement in symptoms.  Problems:  Patient Active Problem List   Diagnosis Date Noted   Chest pain 09/24/2022  Snoring 03/28/2022   Mild intermittent asthma without complication 02/20/2022   Seborrheic dermatitis 12/24/2019   Eczema 12/24/2019   Mixed hyperlipidemia 11/10/2019   Seasonal allergic rhinitis due to pollen 11/10/2019   Gastroesophageal reflux  disease 05/17/2016    Allergies: No Known Allergies Medications:  Current Outpatient Medications:    albuterol  (VENTOLIN  HFA) 108 (90 Base) MCG/ACT inhaler, Inhale 1-2 puffs into the lungs every 6 (six) hours as needed., Disp: 8 g, Rfl: 0   azithromycin  (ZITHROMAX ) 250 MG tablet, Take 2 tablets on day 1, then 1 tablet daily on days 2 through 5, Disp: 6 tablet, Rfl: 0   brompheniramine-pseudoephedrine-DM 30-2-10 MG/5ML syrup, Take 5 mLs by mouth 4 (four) times daily as needed., Disp: 120 mL, Rfl: 0   fluticasone  (FLONASE ) 50 MCG/ACT nasal spray, Place 2 sprays into both nostrils daily., Disp: 16 g, Rfl: 0   sertraline (ZOLOFT) 50 MG tablet, Take 1 tablet (50 mg total) by mouth at bedtime. Start 0.5 tablet (25mg ) PO at bedtime x 1 week, then increase to 1 tablet (50mg ) po at bedtime, Disp: 30 tablet, Rfl: 3  Observations/Objective: Patient is well-developed, well-nourished in no acute distress.  Resting comfortably at home.  Head is normocephalic, atraumatic.  No labored breathing.  Speech is clear and coherent with logical content.  Patient is alert and oriented at baseline.    Assessment and Plan: 1. Acute bacterial bronchitis (Primary) - azithromycin  (ZITHROMAX ) 250 MG tablet; Take 2 tablets on day 1, then 1 tablet daily on days 2 through 5  Dispense: 6 tablet; Refill: 0 - fluticasone  (FLONASE ) 50 MCG/ACT nasal spray; Place 2 sprays into both nostrils daily.  Dispense: 16 g; Refill: 0 - brompheniramine-pseudoephedrine-DM 30-2-10 MG/5ML syrup; Take 5 mLs by mouth 4 (four) times daily as needed.  Dispense: 120 mL; Refill: 0 - albuterol  (VENTOLIN  HFA) 108 (90 Base) MCG/ACT inhaler; Inhale 1-2 puffs into the lungs every 6 (six) hours as needed.  Dispense: 8 g; Refill: 0  2. Depression, major, single episode, mild (HCC) - sertraline (ZOLOFT) 50 MG tablet; Take 1 tablet (50 mg total) by mouth at bedtime. Start 0.5 tablet (25mg ) PO at bedtime x 1 week, then increase to 1 tablet (50mg ) po at  bedtime  Dispense: 30 tablet; Refill: 3  - Worsening over a week despite OTC medications - Will treat with Z-pack and Bromfed DM - Can continue Mucinex   - Push fluids.  - Rest.  - Steam and humidifier can help  - Reports increased depression and anxiety symptoms over the last month secondary to personal reasons - Has tried Valerian root and Ashwagandha without improvement - Will start Sertraline as above - Discussed potential adverse drug reactions and when to stop and seek immediate help  - Discussed CBT, even could utilize [employer], BetterHelp, online options if needed - Follow up with PCP in 4-6 weeks to discuss medication management  - Seek in person evaluation if worsening or symptoms fail to improve    Follow Up Instructions: I discussed the assessment and treatment plan with the patient. The patient was provided an opportunity to ask questions and all were answered. The patient agreed with the plan and demonstrated an understanding of the instructions.  A copy of instructions were sent to the patient via MyChart unless otherwise noted below.    The patient was advised to call back or seek an in-person evaluation if the symptoms worsen or if the condition fails to improve as anticipated.    Mike Kelp, PA-C

## 2024-01-16 NOTE — Telephone Encounter (Signed)
 Chief Complaint: cough Symptoms: cough, fever, chills Frequency: x 5 days Pertinent Negatives: Patient denies sob Disposition: [] ED /[] Urgent Care (no appt availability in office) / [] Appointment(In office/virtual)/ [x]  [facility]/ [] Home Care/ [] Refused Recommended Disposition /[] [facility]/ []  Follow-up with PCP Additional Notes: Pt states that he is having cough, fever, and night sweats. States the fever usually comes in the even and he wakes up soaked. States yellow mucus when blowing his nose. States symptoms started on Saturday with a fever and the cough started on Monday. States he has been taking mucinex  and tylenol for cough and fever. States even with tylenol he still has the chills in the even. States he was sent in the UC and tested negative for COVID and FLU and instructed on OTC medications. States that he has not been taking his temp at home it was elevated when they took it at the UC.   Copied from CRM [id number]. Topic: Clinical - Red Word Triage >> Jan 16, 2024 11:19 AM Emylou G wrote: [facility] that prompted transfer to Nurse Triage: Urgent care on Wednesday 101 temp.Aaron Aas still has fever, mucus yellowish in color, night sweats, cough Reason for Disposition  Fever present > 3 days (72 hours)  Protocols used: Cough - Acute Productive-A-AH

## 2024-05-20 ENCOUNTER — Encounter: Payer: Self-pay | Admitting: *Deleted

## 2024-05-20 ENCOUNTER — Telehealth: Payer: Self-pay | Admitting: *Deleted

## 2024-05-20 NOTE — Telephone Encounter (Signed)
 ATC x 2 no voicemail. Patient needs appt as he has not been seen since 03/28/22.

## 2024-05-25 NOTE — Telephone Encounter (Signed)
 ATC x3.  No VM set up.  Sent mychartmessage for a 2nd time.

## 2024-05-28 ENCOUNTER — Encounter: Payer: Self-pay | Admitting: Nurse Practitioner

## 2024-05-28 ENCOUNTER — Ambulatory Visit: Admitting: Nurse Practitioner

## 2024-05-28 VITALS — BP 114/80 | HR 79 | Temp 99.3°F | Ht 67.0 in | Wt 160.2 lb

## 2024-05-28 DIAGNOSIS — J029 Acute pharyngitis, unspecified: Secondary | ICD-10-CM | POA: Diagnosis not present

## 2024-05-28 LAB — POCT RAPID STREP A (OFFICE): Rapid Strep A Screen: NEGATIVE

## 2024-05-28 MED ORDER — AMOXICILLIN 500 MG PO TABS: 500.0000 mg | ORAL_TABLET | Freq: Two times a day (BID) | ORAL | 0 refills | Status: AC

## 2024-05-28 MED ORDER — PREDNISONE 20 MG PO TABS: 20.0000 mg | ORAL_TABLET | Freq: Every day | ORAL | 0 refills | Status: AC

## 2024-05-28 NOTE — Patient Instructions (Signed)

## 2024-05-28 NOTE — Progress Notes (Signed)
 Acute Patient Office Visit  Subjective:  Patient ID: Mike Peters, male    DOB: 05/23/1991  Age: 33 y.o. MRN: [id number]  CC:  Chief Complaint  Patient presents with   Acute Visit    Inflamed tonsils x 4 days Taking 600 mg of Ibuprofen TID Inflammation is not resolving   Discussed the use of a AI scribe software for clinical note transcription with the patient, who gave verbal consent to proceed.  HPI  Coron Rossano  presents with a sore throat and inflamed tonsils.  He has experienced a sore throat for four days, with pain during swallowing. Inflamed tonsils and sores are present. There is no cough, headache, or fever. Ibuprofen 600 mg taken three times daily for four days has not relieved inflammation. No recent dietary changes.  No change in medication.   HPI   Past Medical History:  Diagnosis Date   Asthma    Murmur     Past Surgical History:  Procedure Laterality Date   ESOPHAGOGASTRODUODENOSCOPY (EGD) WITH PROPOFOL  N/A 02/07/2020   Procedure: ESOPHAGOGASTRODUODENOSCOPY (EGD) WITH PROPOFOL ;  Surgeon: Unk Corinn Skiff, MD;  Location: [facility] ENDOSCOPY;  Service: Gastroenterology;  Laterality: N/A;   NO PAST SURGERIES      Family History  Problem Relation Age of Onset   Diabetes Mother        prediabetes    Social History   Socioeconomic History   Marital status: Single    Spouse name: Not on file   Number of children: Not on file   Years of education: Not on file   Highest education level: Not on file  Occupational History   Not on file  Tobacco Use   Smoking status: Never    Passive exposure: Past   Smokeless tobacco: Never  Vaping Use   Vaping status: Never Used  Substance and Sexual Activity   Alcohol use: Never   Drug use: Never   Sexual activity: Yes    Partners: Female    Birth control/protection: Condom  Other Topics Concern   Not on file  Social History Narrative   Lives with mom and brother - also has a Orthoptist and chihuahua    Social  Drivers of Health   Financial Resource Strain: Low Risk  (01/30/2021)   Overall Financial Resource Strain (CARDIA)    Difficulty of Paying Living Expenses: Not hard at all  Food Insecurity: No Food Insecurity (01/30/2021)   Hunger Vital Sign    Worried About Running Out of Food in the Last Year: Never true    Ran Out of Food in the Last Year: Never true  Transportation Needs: No Transportation Needs (01/30/2021)   PRAPARE - Administrator, Civil Service (Medical): No    Lack of Transportation (Non-Medical): No  Physical Activity: Insufficiently Active (01/30/2021)   Exercise Vital Sign    Days of Exercise per Week: 2 days    Minutes of Exercise per Session: 20 min  Stress: No Stress Concern Present (01/30/2021)   [employer] of Occupational Health - Occupational Stress Questionnaire    Feeling of Stress : Only a little  Social Connections: Moderately Isolated (01/30/2021)   Social Connection and Isolation Panel    Frequency of Communication with Friends and Family: More than three times a week    Frequency of Social Gatherings with Friends and Family: More than three times a week    Attends Religious Services: 1 to 4 times per year    Active Member of [employer]  or Organizations: No    Attends Banker Meetings: Never    Marital Status: Never married  Intimate Partner Violence: Not At Risk (01/30/2021)   Humiliation, Afraid, Rape, and Kick questionnaire    Fear of Current or Ex-Partner: No    Emotionally Abused: No    Physically Abused: No    Sexually Abused: No     Outpatient Medications Prior to Visit  Medication Sig Dispense Refill   albuterol  (VENTOLIN  HFA) 108 (90 Base) MCG/ACT inhaler Inhale 1-2 puffs into the lungs every 6 (six) hours as needed. 8 g 0   brompheniramine-pseudoephedrine-DM 30-2-10 MG/5ML syrup Take 5 mLs by mouth 4 (four) times daily as needed. 120 mL 0   fluticasone  (FLONASE ) 50 MCG/ACT nasal spray Place 2 sprays into both nostrils  daily. 16 g 0   sertraline  (ZOLOFT ) 50 MG tablet Take 1 tablet (50 mg total) by mouth at bedtime. Start 0.5 tablet (25mg ) PO at bedtime x 1 week, then increase to 1 tablet (50mg ) po at bedtime 30 tablet 3   No facility-administered medications prior to visit.    No Known Allergies  ROS Review of Systems  HENT:  Positive for trouble swallowing. Negative for congestion and ear discharge.   Respiratory:  Negative for cough and shortness of breath.   Musculoskeletal:  Negative for neck pain.       Objective:    Physical Exam Constitutional:      Appearance: Normal appearance.  HENT:     Mouth/Throat:     Mouth: Mucous membranes are moist.     Pharynx: Oropharynx is clear. Posterior oropharyngeal erythema and postnasal drip present.     Tonsils: Tonsillar exudate present. 1+ on the right. 1+ on the left.  Eyes:     Conjunctiva/sclera: Conjunctivae normal.     Pupils: Pupils are equal, round, and reactive to light.  Cardiovascular:     Rate and Rhythm: Normal rate and regular rhythm.     Pulses: Normal pulses.     Heart sounds: Normal heart sounds.  Pulmonary:     Effort: Pulmonary effort is normal.     Breath sounds: Normal breath sounds.  Musculoskeletal:     Cervical back: Normal range of motion. No tenderness.  Skin:    General: Skin is warm.     Findings: No bruising.  Neurological:     General: No focal deficit present.     Mental Status: He is alert and oriented to person, place, and time. Mental status is at baseline.  Psychiatric:        Mood and Affect: Mood normal.        Behavior: Behavior normal.        Thought Content: Thought content normal.        Judgment: Judgment normal.     BP 114/80   Pulse 79   Temp 99.3 F (37.4 C)   Ht 5' 7 (1.702 m)   Wt 160 lb 3.2 oz (72.7 kg)   SpO2 97%   BMI 25.09 kg/m  Wt Readings from Last 3 Encounters:  05/28/24 160 lb 3.2 oz (72.7 kg)  12/17/22 162 lb 4 oz (73.6 kg)  09/24/22 158 lb 8 oz (71.9 kg)      Health Maintenance  Topic Date Due   Pneumococcal Vaccine (1 of 2 - PCV) Never done   HPV VACCINES (1 - 3-dose SCDM series) Never done   Hepatitis B Vaccines 19-59 Average Risk (2 of 3 - 19+ 3-dose series) 12/28/2021  COVID-19 Vaccine (4 - 2025-26 season) 06/13/2024 (Originally 04/19/2024)   Influenza Vaccine  11/16/2024 (Originally 03/19/2024)   DTaP/Tdap/Td (9 - Td or Tdap) 05/16/2025   Hepatitis C Screening  Completed   HIV Screening  Completed   Meningococcal B Vaccine  Aged Out       Topic Date Due   HPV VACCINES (1 - 3-dose SCDM series) Never done   Hepatitis B Vaccines 19-59 Average Risk (2 of 3 - 19+ 3-dose series) 12/28/2021    Lab Results  Component Value Date   TSH 6.32 (H) 09/24/2022   Lab Results  Component Value Date   WBC 5.8 09/24/2022   HGB 15.1 09/24/2022   HCT 47.3 09/24/2022   MCV 83.3 09/24/2022   PLT 235 09/24/2022   Lab Results  Component Value Date   NA 139 09/24/2022   K 4.0 09/24/2022   CO2 30 09/24/2022   GLUCOSE 102 (H) 09/24/2022   BUN 15 09/24/2022   CREATININE 0.92 09/24/2022   BILITOT 0.6 09/24/2022   ALKPHOS 100 02/18/2013   AST 22 09/24/2022   ALT 20 09/24/2022   PROT 7.4 09/24/2022   ALBUMIN 4.0 02/18/2013   CALCIUM 9.5 09/24/2022   ANIONGAP 8 09/24/2022   EGFR 110 09/24/2022   Lab Results  Component Value Date   CHOL 191 01/30/2021   Lab Results  Component Value Date   HDL 56 01/30/2021   Lab Results  Component Value Date   LDLCALC 118 (H) 01/30/2021   Lab Results  Component Value Date   TRIG 74 01/30/2021   Lab Results  Component Value Date   CHOLHDL 3.4 01/30/2021   Lab Results  Component Value Date   HGBA1C 5.7 (H) 06/04/2022      Assessment & Plan:  Pharyngitis, unspecified etiology Assessment & Plan: Acute pharyngitis with tonsillar inflammation for four days. Negative strep test. - Given symptoms will treat with amoxicillin and prednisone . - Instructed to consume probiotics or yogurt to  mitigate gastrointestinal side effects of antibiotics. - Recommended saltwater gargles. - Advised over-the-counter chloraseptic spray for pain relief.    Sorethroat -     POCT rapid strep A  Other orders -     Amoxicillin; Take 1 tablet (500 mg total) by mouth 2 (two) times daily for 7 days.  Dispense: 14 tablet; Refill: 0 -     predniSONE ; Take 1 tablet (20 mg total) by mouth daily with breakfast for 5 days.  Dispense: 5 tablet; Refill: 0    Follow-up: Return if symptoms worsen or fail to improve.   Valicia Rief, NP

## 2024-05-28 NOTE — Assessment & Plan Note (Signed)
 Acute pharyngitis with tonsillar inflammation for four days. Negative strep test. - Given symptoms will treat with amoxicillin and prednisone . - Instructed to consume probiotics or yogurt to mitigate gastrointestinal side effects of antibiotics. - Recommended saltwater gargles. - Advised over-the-counter chloraseptic spray for pain relief.

## 2024-07-21 ENCOUNTER — Encounter: Payer: Self-pay | Admitting: Nurse Practitioner

## 2024-07-21 ENCOUNTER — Ambulatory Visit: Admitting: Nurse Practitioner

## 2024-07-21 VITALS — BP 128/84 | HR 66 | Temp 98.0°F | Ht 67.0 in | Wt 154.0 lb

## 2024-07-21 DIAGNOSIS — R7303 Prediabetes: Secondary | ICD-10-CM

## 2024-07-21 DIAGNOSIS — E782 Mixed hyperlipidemia: Secondary | ICD-10-CM

## 2024-07-21 DIAGNOSIS — Z0000 Encounter for general adult medical examination without abnormal findings: Secondary | ICD-10-CM

## 2024-07-21 DIAGNOSIS — Z130 Encounter for screening for diseases of the blood and blood-forming organs and certain disorders involving the immune mechanism: Secondary | ICD-10-CM | POA: Diagnosis not present

## 2024-07-21 DIAGNOSIS — J4520 Mild intermittent asthma, uncomplicated: Secondary | ICD-10-CM

## 2024-07-21 DIAGNOSIS — Z113 Encounter for screening for infections with a predominantly sexual mode of transmission: Secondary | ICD-10-CM | POA: Diagnosis not present

## 2024-07-21 NOTE — Patient Instructions (Signed)
 For non-scheduling related questions, please contact the cardiac imaging nurse navigator should you have any questions/concerns: Camie Shutter, Cardiac Imaging Nurse Navigator Chantal Requena, Cardiac Imaging Nurse Navigator [facility] Direct Office Dial: [phone]    For scheduling needs, including cancellations and rescheduling, please call Brittany, [phone].

## 2024-07-21 NOTE — Progress Notes (Signed)
 Name: Gearold Wainer   MRN: [id number]    DOB: [birth year]   Date:07/21/2024       Progress Note  Subjective  Chief Complaint  Chief Complaint  Patient presents with   Annual Exam    HPI  Patient presents for annual CPE. Discussed the use of AI scribe software for clinical note transcription with the patient, who gave verbal consent to proceed.  History of Present Illness Kedrick Mcnamee is a 33 year old male who presents for a routine follow-up and annual physical exam.  Asthma - Asthma managed with albuterol  as needed - No recent acute exacerbations  Cardiac symptoms and evaluation - Visited emergency department approximately one year ago for cardiac concerns - Echocardiogram was normal - CT scan of coronary arteries was ordered but not completed - Lost to follow-up with cardiology Echo: Left Ventricle: Left ventricular ejection fraction, by estimation, is 55  to 60%. The left ventricle has normal function. The left ventricle has no  regional wall motion abnormalities. The left ventricular internal cavity  size was normal in size. There is   no left ventricular hypertrophy. Left ventricular diastolic parameters  were normal.   Right Ventricle: The right ventricular size is normal. No increase in  right ventricular wall thickness. Right ventricular systolic function is  normal.   Left Atrium: Left atrial size was normal in size.   Right Atrium: Right atrial size was normal in size.   Pericardium: There is no evidence of pericardial effusion.   Mitral Valve: The mitral valve is normal in structure. No evidence of  mitral valve regurgitation.   Tricuspid Valve: The tricuspid valve is normal in structure. Tricuspid  valve regurgitation is not demonstrated.   Aortic Valve: The aortic valve is tricuspid. Aortic valve regurgitation is  not visualized. Aortic valve mean gradient measures 2.0 mmHg. Aortic valve  peak gradient measures 3.7 mmHg. Aortic valve area, by VTI  measures 3.16  cm.   Pulmonic Valve: The pulmonic valve was not well visualized. Pulmonic valve  regurgitation is not visualized.   Aorta: Aortic dilatation noted. There is mild dilatation of the ascending  aorta, measuring 39 mm.   Venous: The inferior vena cava is normal in size with greater than 50%  respiratory variability, suggesting right atrial pressure of 3 mmHg.    Glycemic status - Last hemoglobin A1c was 5.7 - No recent A1c test performed or discussed during this visit  Lifestyle and preventive health - Consumes a lot of fast food - Engages in running once or twice a week - Sleep improved from 4-5 hours to approximately 7 hours per night - Last dental exam in September, approximately two months ago  Constitutional and genitourinary symptoms - No specific concerns or acute issues - No problems with urination  Mental health screening - Depression screening negative      Diet: well balanced diet Exercise: run 1-2 week Sleep: 7 hours Last dental exam:Jan 2023 Last eye exam: 2 months ago  Depression: phq 9 is negative    07/21/2024    9:49 AM 05/28/2024   11:04 AM 09/24/2022    3:24 PM 06/28/2022    3:19 PM 06/04/2022    1:53 PM  Depression screen PHQ 2/9  Decreased Interest 0 0 0 0 0  Down, Depressed, Hopeless 0 0 0 0 0  PHQ - 2 Score 0 0 0 0 0  Altered sleeping  0 0    Tired, decreased energy  3 0    Change in  appetite  0 0    Feeling bad or failure about yourself   0 0    Trouble concentrating  0 0    Moving slowly or fidgety/restless  0 0    Suicidal thoughts  0 0    PHQ-9 Score  3  0     Difficult doing work/chores  Not difficult at all Not difficult at all       Data saved with a previous flowsheet row definition    Hypertension:  BP Readings from Last 3 Encounters:  07/21/24 128/84  05/28/24 114/80  12/17/22 110/62    Obesity: Wt Readings from Last 3 Encounters:  07/21/24 154 lb (69.9 kg)  05/28/24 160 lb 3.2 oz (72.7 kg)  12/17/22  162 lb 4 oz (73.6 kg)   BMI Readings from Last 3 Encounters:  07/21/24 24.12 kg/m  05/28/24 25.09 kg/m  12/17/22 25.41 kg/m     Lipids:  Lab Results  Component Value Date   CHOL 191 01/30/2021   CHOL 212 (H) 11/10/2019   CHOL 202 (H) 09/15/2018   Lab Results  Component Value Date   HDL 56 01/30/2021   HDL 64 11/10/2019   HDL 69 09/15/2018   Lab Results  Component Value Date   LDLCALC 118 (H) 01/30/2021   LDLCALC 132 (H) 11/10/2019   LDLCALC 117 (H) 09/15/2018   Lab Results  Component Value Date   TRIG 74 01/30/2021   TRIG 66 11/10/2019   TRIG 67 09/15/2018   Lab Results  Component Value Date   CHOLHDL 3.4 01/30/2021   CHOLHDL 3.3 11/10/2019   CHOLHDL 2.9 09/15/2018   No results found for: LDLDIRECT Glucose:  Glucose  Date Value Ref Range Status  02/18/2013 89 65 - 99 mg/dL Final   Glucose, Bld  Date Value Ref Range Status  09/24/2022 102 (H) 70 - 99 mg/dL Final    Comment:    Glucose reference range applies only to samples taken after fasting for at least 8 hours.  09/24/2022 107 (H) 65 - 99 mg/dL Final    Comment:    .            Fasting reference interval . For someone without known diabetes, a glucose value between 100 and 125 mg/dL is consistent with prediabetes and should be confirmed with a follow-up test. .   06/04/2022 87 65 - 99 mg/dL Final    Comment:    .            Fasting reference interval .     Flowsheet Row Office Visit from 07/21/2024 in [facility]  AUDIT-C Score 0     Single STD testing and prevention (HIV/chl/gon/syphilis): completed Hep C: completed  Skin cancer: Discussed monitoring for atypical lesions Colorectal cancer: does not qualify Prostate cancer: does not qualify No results found for: PSA   Lung cancer:   Low Dose CT Chest recommended if Age 63-80 years, 30 pack-year currently smoking OR have quit w/in 15years. Patient does not qualify.   AAA:  The USPSTF recommends  one-time screening with ultrasonography in men ages 58 to 35 years who have ever smoked ECG:  12/17/2022  Vaccines:  HPV: up to at age 67 , ask insurance if age between 56-45  Shingrix: 17-64 yo and ask insurance if covered when patient above 34 yo Pneumonia:  educated and discussed with patient. Flu:  educated and discussed with patient.  Advanced Care Planning: A voluntary discussion about advance care planning  including the explanation and discussion of advance directives.  Discussed health care proxy and Living will, and the patient was able to identify a health care proxy as Mom.  Patient does not have a living will at present time. If patient does have living will, I have requested they bring this to the clinic to be scanned in to their chart.  Patient Active Problem List   Diagnosis Date Noted   Pharyngitis 05/28/2024   Chest pain 09/24/2022   Snoring 03/28/2022   Mild intermittent asthma without complication 02/20/2022   Seborrheic dermatitis 12/24/2019   Eczema 12/24/2019   Mixed hyperlipidemia 11/10/2019   Seasonal allergic rhinitis due to pollen 11/10/2019   Gastroesophageal reflux disease 05/17/2016    Past Surgical History:  Procedure Laterality Date   ESOPHAGOGASTRODUODENOSCOPY (EGD) WITH PROPOFOL  N/A 02/07/2020   Procedure: ESOPHAGOGASTRODUODENOSCOPY (EGD) WITH PROPOFOL ;  Surgeon: Unk Corinn Skiff, MD;  Location: [facility] ENDOSCOPY;  Service: Gastroenterology;  Laterality: N/A;   NO PAST SURGERIES      Family History  Problem Relation Age of Onset   Diabetes Mother        prediabetes    Social History   Socioeconomic History   Marital status: Single    Spouse name: Not on file   Number of children: Not on file   Years of education: Not on file   Highest education level: Not on file  Occupational History   Not on file  Tobacco Use   Smoking status: Never    Passive exposure: Past   Smokeless tobacco: Never  Vaping Use   Vaping status: Never Used   Substance and Sexual Activity   Alcohol use: Never   Drug use: Never   Sexual activity: Yes    Partners: Female    Birth control/protection: Condom  Other Topics Concern   Not on file  Social History Narrative   Lives with mom and brother - also has a orthoptist and chihuahua    Social Drivers of Health   Financial Resource Strain: Low Risk  (07/21/2024)   Overall Financial Resource Strain (CARDIA)    Difficulty of Paying Living Expenses: Not hard at all  Food Insecurity: No Food Insecurity (07/21/2024)   Hunger Vital Sign    Worried About Running Out of Food in the Last Year: Never true    Ran Out of Food in the Last Year: Never true  Transportation Needs: No Transportation Needs (07/21/2024)   PRAPARE - Administrator, Civil Service (Medical): No    Lack of Transportation (Non-Medical): No  Physical Activity: Insufficiently Active (07/21/2024)   Exercise Vital Sign    Days of Exercise per Week: 2 days    Minutes of Exercise per Session: 30 min  Stress: No Stress Concern Present (07/21/2024)   [employer] of Occupational Health - Occupational Stress Questionnaire    Feeling of Stress: Not at all  Social Connections: Moderately Isolated (07/21/2024)   Social Connection and Isolation Panel    Frequency of Communication with Friends and Family: Twice a week    Frequency of Social Gatherings with Friends and Family: Twice a week    Attends Religious Services: More than 4 times per year    Active Member of [employer] or Organizations: No    Attends Banker Meetings: Never    Marital Status: Never married  Intimate Partner Violence: Not At Risk (07/21/2024)   Humiliation, Afraid, Rape, and Kick questionnaire    Fear of Current or Ex-Partner: No  Emotionally Abused: No    Physically Abused: No    Sexually Abused: No     Current Outpatient Medications:    albuterol  (VENTOLIN  HFA) 108 (90 Base) MCG/ACT inhaler, Inhale 1-2 puffs into the lungs every 6 (six)  hours as needed., Disp: 8 g, Rfl: 0   brompheniramine-pseudoephedrine-DM 30-2-10 MG/5ML syrup, Take 5 mLs by mouth 4 (four) times daily as needed., Disp: 120 mL, Rfl: 0   fluticasone  (FLONASE ) 50 MCG/ACT nasal spray, Place 2 sprays into both nostrils daily., Disp: 16 g, Rfl: 0  No Known Allergies   ROS  Constitutional: Negative for fever or weight change.  Respiratory: Negative for cough and shortness of breath.   Cardiovascular: Negative for chest pain or palpitations.  Gastrointestinal: Negative for abdominal pain, no bowel changes.  Musculoskeletal: Negative for gait problem or joint swelling.  Skin: Negative for rash.  Neurological: Negative for dizziness or headache.  No other specific complaints in a complete review of systems (except as listed in HPI above).    Objective  Vitals:   07/21/24 0943  BP: 128/84  Pulse: 66  Temp: 98 F (36.7 C)  SpO2: 97%  Weight: 154 lb (69.9 kg)  Height: 5' 7 (1.702 m)    Body mass index is 24.12 kg/m.  Physical Exam Vitals reviewed.  Constitutional:      Appearance: Normal appearance.  HENT:     Head: Normocephalic.     Right Ear: Tympanic membrane normal.     Left Ear: Tympanic membrane normal.     Nose: Nose normal.  Eyes:     Extraocular Movements: Extraocular movements intact.     Conjunctiva/sclera: Conjunctivae normal.     Pupils: Pupils are equal, round, and reactive to light.  Neck:     Thyroid: No thyroid mass, thyromegaly or thyroid tenderness.  Cardiovascular:     Rate and Rhythm: Normal rate and regular rhythm.     Pulses: Normal pulses.     Heart sounds: Normal heart sounds.  Pulmonary:     Effort: Pulmonary effort is normal.     Breath sounds: Normal breath sounds.  Abdominal:     General: Bowel sounds are normal.     Palpations: Abdomen is soft.  Musculoskeletal:        General: Normal range of motion.     Cervical back: Normal range of motion and neck supple.     Right lower leg: No edema.     Left  lower leg: No edema.  Skin:    General: Skin is warm and dry.     Capillary Refill: Capillary refill takes less than 2 seconds.  Neurological:     General: No focal deficit present.     Mental Status: He is alert and oriented to person, place, and time. Mental status is at baseline.  Psychiatric:        Mood and Affect: Mood normal.        Behavior: Behavior normal.        Thought Content: Thought content normal.        Judgment: Judgment normal.      Recent Results (from the past 2160 hours)  POCT rapid strep A     Status: None   Collection Time: 05/28/24 11:27 AM  Result Value Ref Range   Rapid Strep A Screen Negative Negative     Fall Risk:    05/28/2024   11:04 AM 09/24/2022    3:24 PM 06/28/2022    3:18 PM 06/04/2022  1:52 PM 04/29/2022   11:50 AM  Fall Risk   Falls in the past year? 0 0 0 0 0  Number falls in past yr: 0 0 0 0 0  Injury with Fall? 0  0  0  0  0   Risk for fall due to : No Fall Risks No Fall Risks     Follow up Falls evaluation completed Falls prevention discussed;Education provided;Falls evaluation completed Falls evaluation completed  Falls evaluation completed  Falls evaluation completed      Data saved with a previous flowsheet row definition      Functional Status Survey: Is the patient deaf or have difficulty hearing?: No Does the patient have difficulty seeing, even when wearing glasses/contacts?: No Does the patient have difficulty concentrating, remembering, or making decisions?: No Does the patient have difficulty walking or climbing stairs?: No Does the patient have difficulty dressing or bathing?: No Does the patient have difficulty doing errands alone such as visiting a doctor's office or shopping?: No    Assessment & Plan  Problem List Items Addressed This Visit       Respiratory   Mild intermittent asthma without complication     Other   Mixed hyperlipidemia   Other Visit Diagnoses       Annual physical exam    -   Primary     Prediabetes         Screening for deficiency anemia          Assessment and Plan Assessment & Plan Adult Wellness Visit Routine follow-up visit with no specific concerns. Depression screening negative. Blood pressure within normal range. Last dental exam two months ago. Improved sleep duration from 4-5 hours to 7 hours. Diet includes fast food, exercise includes running once or twice a week. - Repeated HIV and hepatitis B screening  Asthma Managed with albuterol  as needed. - Continue albuterol  as needed  Mixed hyperlipidemia Last lipid panel in 2022 showed hyperlipidemia with LDL at 118 mg/dL.  Prediabetes Last A1c in the prediabetic range at 5.7%.  Cardiac vessel dilation, under evaluation Cardiac vessel dilation noted on echocardiogram in 2020. CT scan was ordered but not completed. Echocardiogram showed no heart failure. Follow-up with cardiology was recommended but not completed. - Provided contact information for cardiology office to schedule follow-up - Instructed to contact nurse navigator for CT scan scheduling      -Prostate cancer screening and PSA options (with potential risks and benefits of testing vs not testing) were discussed along with recent recs/guidelines. -USPSTF grade A and B recommendations reviewed with patient; age-appropriate recommendations, preventive care, screening tests, etc discussed and encouraged; healthy living encouraged; see AVS for patient education given to patient -Discussed importance of 150 minutes of physical activity weekly, eat two servings of fish weekly, eat one serving of tree nuts ( cashews, pistachios, pecans, almonds.SABRA) every other day, eat 6 servings of fruit/vegetables daily and drink plenty of water and avoid sweet beverages.  -Reviewed Health Maintenance: yes

## 2024-07-23 ENCOUNTER — Ambulatory Visit: Payer: Self-pay | Admitting: Nurse Practitioner

## 2024-07-23 LAB — COMPREHENSIVE METABOLIC PANEL WITH GFR
AG Ratio: 2 (calc) (ref 1.0–2.5)
ALT: 21 U/L (ref 9–46)
AST: 20 U/L (ref 10–40)
Albumin: 4.7 g/dL (ref 3.6–5.1)
Alkaline phosphatase (APISO): 68 U/L (ref 36–130)
BUN: 10 mg/dL (ref 7–25)
CO2: 31 mmol/L (ref 20–32)
Calcium: 9.8 mg/dL (ref 8.6–10.3)
Chloride: 101 mmol/L (ref 98–110)
Creat: 0.96 mg/dL (ref 0.60–1.26)
Globulin: 2.3 g/dL (ref 1.9–3.7)
Glucose, Bld: 90 mg/dL (ref 65–99)
Potassium: 4.4 mmol/L (ref 3.5–5.3)
Sodium: 139 mmol/L (ref 135–146)
Total Bilirubin: 0.9 mg/dL (ref 0.2–1.2)
Total Protein: 7 g/dL (ref 6.1–8.1)
eGFR: 107 mL/min/1.73m2 (ref >=60)

## 2024-07-23 LAB — LIPID PANEL
Cholesterol: 198 mg/dL (ref <200)
HDL: 70 mg/dL (ref >=40)
LDL Cholesterol (Calc): 114 mg/dL — ABNORMAL HIGH
Non-HDL Cholesterol (Calc): 128 mg/dL (ref <130)
Total CHOL/HDL Ratio: 2.8 (calc) (ref <5.0)
Triglycerides: 53 mg/dL (ref <150)

## 2024-07-23 LAB — CBC WITH DIFFERENTIAL/PLATELET
Absolute Lymphocytes: 1560 {cells}/uL (ref 850–3900)
Absolute Monocytes: 437 {cells}/uL (ref 200–950)
Basophils Absolute: 28 {cells}/uL (ref 0–200)
Basophils Relative: 0.6 %
Eosinophils Absolute: 99 {cells}/uL (ref 15–500)
Eosinophils Relative: 2.1 %
HCT: 49.9 % (ref 39.4–51.1)
Hemoglobin: 15.8 g/dL (ref 13.2–17.1)
MCH: 26.8 pg — ABNORMAL LOW (ref 27.0–33.0)
MCHC: 31.7 g/dL (ref 31.6–35.4)
MCV: 84.6 fL (ref 81.4–101.7)
MPV: 11.8 fL (ref 7.5–12.5)
Monocytes Relative: 9.3 %
Neutro Abs: 2576 {cells}/uL (ref 1500–7800)
Neutrophils Relative %: 54.8 %
Platelets: 239 Thousand/uL (ref 140–400)
RBC: 5.9 Million/uL — ABNORMAL HIGH (ref 4.20–5.80)
RDW: 13.2 % (ref 11.0–15.0)
Total Lymphocyte: 33.2 %
WBC: 4.7 Thousand/uL (ref 3.8–10.8)

## 2024-07-23 LAB — HEMOGLOBIN A1C
Hgb A1c MFr Bld: 5.6 % (ref <5.7)
Mean Plasma Glucose: 114 mg/dL
eAG (mmol/L): 6.3 mmol/L

## 2024-07-23 LAB — SYPHILIS: RPR W/REFLEX TO RPR TITER AND TREPONEMAL ANTIBODIES, TRADITIONAL SCREENING AND DIAGNOSIS ALGORITHM: RPR Ser Ql: NONREACTIVE

## 2024-07-23 LAB — HIV ANTIBODY (ROUTINE TESTING W REFLEX)
HIV 1&2 Ab, 4th Generation: NONREACTIVE
HIV FINAL INTERPRETATION: NEGATIVE

## 2024-07-23 LAB — HEPATITIS C ANTIBODY: Hepatitis C Ab: NONREACTIVE

## 2024-08-11 ENCOUNTER — Ambulatory Visit

## 2024-08-24 ENCOUNTER — Ambulatory Visit: Admitting: Nurse Practitioner

## 2024-08-26 ENCOUNTER — Encounter: Payer: Self-pay | Admitting: Nurse Practitioner

## 2024-08-26 ENCOUNTER — Ambulatory Visit: Admitting: Nurse Practitioner

## 2024-08-26 VITALS — BP 124/86 | HR 62 | Temp 98.0°F | Ht 67.0 in | Wt 160.0 lb

## 2024-08-26 DIAGNOSIS — J0140 Acute pansinusitis, unspecified: Secondary | ICD-10-CM | POA: Diagnosis not present

## 2024-08-26 DIAGNOSIS — L819 Disorder of pigmentation, unspecified: Secondary | ICD-10-CM | POA: Diagnosis not present

## 2024-08-26 DIAGNOSIS — L700 Acne vulgaris: Secondary | ICD-10-CM | POA: Diagnosis not present

## 2024-08-26 DIAGNOSIS — R051 Acute cough: Secondary | ICD-10-CM | POA: Diagnosis not present

## 2024-08-26 MED ORDER — HYDROQUINONE 4 % EX CREA: TOPICAL_CREAM | Freq: Two times a day (BID) | CUTANEOUS | 1 refills | Status: AC

## 2024-08-26 MED ORDER — PROMETHAZINE-DM 6.25-15 MG/5ML PO SYRP: 5.0000 mL | ORAL_SOLUTION | Freq: Four times a day (QID) | ORAL | 0 refills | Status: AC | PRN

## 2024-08-26 MED ORDER — TRETINOIN 0.05 % EX CREA: TOPICAL_CREAM | Freq: Every day | CUTANEOUS | 3 refills | Status: AC

## 2024-08-26 MED ORDER — AMOXICILLIN-POT CLAVULANATE 875-125 MG PO TABS: 1.0000 | ORAL_TABLET | Freq: Two times a day (BID) | ORAL | 0 refills | Status: AC

## 2024-08-26 NOTE — Progress Notes (Signed)
 "  BP 124/86   Pulse 62   Temp 98 F (36.7 C)   Ht 5' 7 (1.702 m)   Wt 160 lb (72.6 kg)   SpO2 98%   BMI 25.06 kg/m    Subjective:    Patient ID: Mike Peters, male    DOB: 1991-01-08, 34 y.o.   MRN: [id number]  HPI: Mike Peters is a 34 y.o. male  Chief Complaint  Patient presents with   Cough    Pt c/o cough, congestion x2 weeks.    Discussed the use of AI scribe software for clinical note transcription with the patient, who gave verbal consent to proceed.  History of Present Illness [name] is a 34 year old male who presents with cough and congestion for two weeks.  Cough and nasal congestion - Cough and congestion present for two weeks - Symptoms worsen at night, particularly after midnight - Similar episode occurred in October; over-the-counter flu test was negative at that time - No fever or sore throat - Symptoms are not significantly bothersome during the day - Uses Mucinex  nighttime with inconsistent relief - Uses Flonase  for symptom management  Acne and hyperpigmentation - Requests tretinoin  cream for acne around the lip - Requests hydroquinone  for pigmentation issues on the nose - Has used tretinoin  previously - No prior use of hydroquinone   Medication concerns - Requests removal of an antidepressant previously prescribed via video call, as it was never used - Requests removal of albuterol , as it is not being used         08/26/2024   12:57 PM 07/21/2024    9:49 AM 05/28/2024   11:04 AM  Depression screen PHQ 2/9  Decreased Interest 0 0 0  Down, Depressed, Hopeless 0 0 0  PHQ - 2 Score 0 0 0  Altered sleeping 0  0  Tired, decreased energy 0  3  Change in appetite 0  0  Feeling bad or failure about yourself  0  0  Trouble concentrating 0  0  Moving slowly or fidgety/restless 0  0  Suicidal thoughts 0  0  PHQ-9 Score 0  3   Difficult doing work/chores   Not difficult at all     Data saved with a previous flowsheet row definition     Relevant past medical, surgical, family and social history reviewed and updated as indicated. Interim medical history since our last visit reviewed. Allergies and medications reviewed and updated.  Review of Systems  Ten systems reviewed and is negative except as mentioned in HPI      Objective:      BP 124/86   Pulse 62   Temp 98 F (36.7 C)   Ht 5' 7 (1.702 m)   Wt 160 lb (72.6 kg)   SpO2 98%   BMI 25.06 kg/m    Wt Readings from Last 3 Encounters:  08/26/24 160 lb (72.6 kg)  07/21/24 154 lb (69.9 kg)  05/28/24 160 lb 3.2 oz (72.7 kg)    Physical Exam GENERAL: Alert, cooperative, well developed, no acute distress HEENT: Normocephalic, normal oropharynx, moist mucous membranes, no sinus tenderness CHEST: Clear to auscultation bilaterally, no wheezes, rhonchi, or crackles CARDIOVASCULAR: Normal heart rate and rhythm, S1 and S2 normal without murmurs ABDOMEN: Soft, non-tender, non-distended, without organomegaly, normal bowel sounds EXTREMITIES: No cyanosis or edema NEUROLOGICAL: Cranial nerves grossly intact, moves all extremities without gross motor or sensory deficit  Results for orders placed or performed in visit on 07/21/24  CBC with  Differential/Platelet   Collection Time: 07/21/24 10:24 AM  Result Value Ref Range   WBC 4.7 3.8 - 10.8 Thousand/uL   RBC 5.90 (H) 4.20 - 5.80 Million/uL   Hemoglobin 15.8 13.2 - 17.1 g/dL   HCT 50.0 60.5 - 48.8 %   MCV 84.6 81.4 - 101.7 fL   MCH 26.8 (L) 27.0 - 33.0 pg   MCHC 31.7 31.6 - 35.4 g/dL   RDW 86.7 88.9 - 84.9 %   Platelets 239 140 - 400 Thousand/uL   MPV 11.8 7.5 - 12.5 fL   Neutro Abs 2,576 1,500 - 7,800 cells/uL   Absolute Lymphocytes 1,560 850 - 3,900 cells/uL   Absolute Monocytes 437 200 - 950 cells/uL   Eosinophils Absolute 99 15 - 500 cells/uL   Basophils Absolute 28 0 - 200 cells/uL   Neutrophils Relative % 54.8 %   Total Lymphocyte 33.2 %   Monocytes Relative 9.3 %   Eosinophils Relative 2.1 %    Basophils Relative 0.6 %  Comprehensive metabolic panel with GFR   Collection Time: 07/21/24 10:24 AM  Result Value Ref Range   Glucose, Bld 90 65 - 99 mg/dL   BUN 10 7 - 25 mg/dL   Creat 9.03 9.39 - 8.73 mg/dL   eGFR 892 > OR = 60 fO/fpw/8.26f7   BUN/Creatinine Ratio SEE NOTE: 6 - 22 (calc)   Sodium 139 135 - 146 mmol/L   Potassium 4.4 3.5 - 5.3 mmol/L   Chloride 101 98 - 110 mmol/L   CO2 31 20 - 32 mmol/L   Calcium 9.8 8.6 - 10.3 mg/dL   Total Protein 7.0 6.1 - 8.1 g/dL   Albumin 4.7 3.6 - 5.1 g/dL   Globulin 2.3 1.9 - 3.7 g/dL (calc)   AG Ratio 2.0 1.0 - 2.5 (calc)   Total Bilirubin 0.9 0.2 - 1.2 mg/dL   Alkaline phosphatase (APISO) 68 36 - 130 U/L   AST 20 10 - 40 U/L   ALT 21 9 - 46 U/L  Hemoglobin A1c   Collection Time: 07/21/24 10:24 AM  Result Value Ref Range   Hgb A1c MFr Bld 5.6 <5.7 %   Mean Plasma Glucose 114 mg/dL   eAG (mmol/L) 6.3 mmol/L  Lipid panel   Collection Time: 07/21/24 10:24 AM  Result Value Ref Range   Cholesterol 198 <200 mg/dL   HDL 70 > OR = 40 mg/dL   Triglycerides 53 <849 mg/dL   LDL Cholesterol (Calc) 114 (H) mg/dL (calc)   Total CHOL/HDL Ratio 2.8 <5.0 (calc)   Non-HDL Cholesterol (Calc) 128 <130 mg/dL (calc)  HIV Antibody (routine testing w rflx)   Collection Time: 07/21/24 10:24 AM  Result Value Ref Range   HIV FINAL INTERPRETATION HIV NEGATIVE    HIV 1&2 Ab, 4th Generation NON-REACTIVE NON-REACTIVE  Hepatitis C antibody   Collection Time: 07/21/24 10:24 AM  Result Value Ref Range   Hepatitis C Ab NON-REACTIVE NON-REACTIVE  RPR W/RFLX TO RPR TITER, TREPONEMAL AB, SCREEN AND DIAGNOSIS   Collection Time: 07/21/24 10:24 AM  Result Value Ref Range   RPR Ser Ql NON-REACTIVE NON-REACTIVE          Assessment & Plan:   Problem List Items Addressed This Visit   None Visit Diagnoses       Acute non-recurrent pansinusitis    -  Primary   Relevant Medications   amoxicillin -clavulanate (AUGMENTIN ) 875-125 MG tablet    promethazine -dextromethorphan (PROMETHAZINE -DM) 6.25-15 MG/5ML syrup     Acute cough  Relevant Medications   amoxicillin -clavulanate (AUGMENTIN ) 875-125 MG tablet   promethazine -dextromethorphan (PROMETHAZINE -DM) 6.25-15 MG/5ML syrup     Discoloration of skin       Relevant Medications   hydroquinone  4 % cream     Acne vulgaris       Relevant Medications   tretinoin  (RETIN-A ) 0.05 % cream        Assessment and Plan Assessment & Plan Acute pansinusitis Cough and congestion for two weeks, primarily nocturnal. No fever or sore throat. Lungs clear on examination. Previous similar episode in October. - Prescribed antibiotic for sinus infection - Prescribed cough syrup for nighttime use - Advised to continue Mucinex  - Advised to drink plenty of fluids - Advised to avoid dairy to prevent mucus thickening  Acne vulgaris Acne around the lips. Previous use of tretinoin . - Prescribed tretinoin  cream for acne  Skin pigmentation disorder Discoloration on the nose. Request for hydroquinone  cream to address pigmentation issues. Discussed potential side effects and advised to discontinue if adverse reactions occur. - Prescribed hydroquinone  cream for pigmentation        Follow up plan: Return if symptoms worsen or fail to improve. "

## 2024-09-08 ENCOUNTER — Encounter: Payer: Self-pay | Admitting: Nurse Practitioner

## 2024-09-08 ENCOUNTER — Ambulatory Visit (INDEPENDENT_AMBULATORY_CARE_PROVIDER_SITE_OTHER): Admitting: Nurse Practitioner

## 2024-09-08 VITALS — BP 122/70 | HR 67 | Temp 99.4°F | Ht 67.0 in | Wt 160.0 lb

## 2024-09-08 DIAGNOSIS — U071 COVID-19: Secondary | ICD-10-CM

## 2024-09-08 LAB — POC COVID19/FLU A&B COMBO
Covid Antigen, POC: POSITIVE — AB
Influenza A Antigen, POC: NEGATIVE
Influenza B Antigen, POC: NEGATIVE

## 2024-09-08 MED ORDER — NIRMATRELVIR/RITONAVIR (PAXLOVID)TABLET: 3.0000 | ORAL_TABLET | Freq: Two times a day (BID) | ORAL | 0 refills | Status: AC

## 2024-09-08 NOTE — Progress Notes (Signed)
 "  BP 122/70   Pulse 67   Temp 99.4 F (37.4 C)   Ht 5' 7 (1.702 m)   Wt 160 lb (72.6 kg)   SpO2 98%   BMI 25.06 kg/m    Subjective:    Patient ID: Mike Peters, male    DOB: 1991/01/18, 34 y.o.   MRN: [id number]  HPI: Mike Peters is a 34 y.o. male  Chief Complaint  Patient presents with   Medical Management of Chronic Issues    Pt states symptoms slighty improved with augmentin  but have started to worsen over the past 3 days.    Discussed the use of AI scribe software for clinical note transcription with the patient, who gave verbal consent to proceed.  History of Present Illness Mike Peters is a 34 year old male who presents with recurrent sinus infection symptoms and a positive COVID-19 test.  Upper respiratory symptoms - Recurrent sinus infection symptoms since August 26, 2024 - Initial improvement with Augmentin , but symptoms recurred on September 05, 2024 - Current symptoms include nasal burning and post nasal drip - Fever of 100.92F recorded on September 08, 2024  Covid-19 infection - Initial home tests for flu and COVID-19 were negative, but accuracy was questioned - Subsequent test confirmed positive COVID-19 result - Received recent influenza vaccination  Exposure risk - Wife is three months pregnant and may have been exposed to COVID-19 as he sleeps together  Functional status - Scheduled to return to work on September 11, 2024, but uncertain about ability to do so due to current illness         08/26/2024   12:57 PM 07/21/2024    9:49 AM 05/28/2024   11:04 AM  Depression screen PHQ 2/9  Decreased Interest 0 0 0  Down, Depressed, Hopeless 0 0 0  PHQ - 2 Score 0 0 0  Altered sleeping 0  0  Tired, decreased energy 0  3  Change in appetite 0  0  Feeling bad or failure about yourself  0  0  Trouble concentrating 0  0  Moving slowly or fidgety/restless 0  0  Suicidal thoughts 0  0  PHQ-9 Score 0  3   Difficult doing work/chores   Not difficult at all      Data saved with a previous flowsheet row definition    Relevant past medical, surgical, family and social history reviewed and updated as indicated. Interim medical history since our last visit reviewed. Allergies and medications reviewed and updated.  Review of Systems  Ten systems reviewed and is negative except as mentioned in HPI      Objective:      BP 122/70   Pulse 67   Temp 99.4 F (37.4 C)   Ht 5' 7 (1.702 m)   Wt 160 lb (72.6 kg)   SpO2 98%   BMI 25.06 kg/m    Wt Readings from Last 3 Encounters:  09/08/24 160 lb (72.6 kg)  08/26/24 160 lb (72.6 kg)  07/21/24 154 lb (69.9 kg)    Physical Exam GENERAL: Alert, cooperative, well developed, no acute distress HEENT: Normocephalic, normal oropharynx, moist mucous membranes CHEST: Clear to auscultation bilaterally, No wheezes, rhonchi, or crackles CARDIOVASCULAR: Normal heart rate and rhythm, S1 and S2 normal without murmurs ABDOMEN: Soft, non-tender, non-distended, without organomegaly, Normal bowel sounds EXTREMITIES: No cyanosis or edema NEUROLOGICAL: Cranial nerves grossly intact, Moves all extremities without gross motor or sensory deficit  Results for orders placed or performed in visit on  07/21/24  CBC with Differential/Platelet   Collection Time: 07/21/24 10:24 AM  Result Value Ref Range   WBC 4.7 3.8 - 10.8 Thousand/uL   RBC 5.90 (H) 4.20 - 5.80 Million/uL   Hemoglobin 15.8 13.2 - 17.1 g/dL   HCT 50.0 60.5 - 48.8 %   MCV 84.6 81.4 - 101.7 fL   MCH 26.8 (L) 27.0 - 33.0 pg   MCHC 31.7 31.6 - 35.4 g/dL   RDW 86.7 88.9 - 84.9 %   Platelets 239 140 - 400 Thousand/uL   MPV 11.8 7.5 - 12.5 fL   Neutro Abs 2,576 1,500 - 7,800 cells/uL   Absolute Lymphocytes 1,560 850 - 3,900 cells/uL   Absolute Monocytes 437 200 - 950 cells/uL   Eosinophils Absolute 99 15 - 500 cells/uL   Basophils Absolute 28 0 - 200 cells/uL   Neutrophils Relative % 54.8 %   Total Lymphocyte 33.2 %   Monocytes Relative 9.3 %    Eosinophils Relative 2.1 %   Basophils Relative 0.6 %  Comprehensive metabolic panel with GFR   Collection Time: 07/21/24 10:24 AM  Result Value Ref Range   Glucose, Bld 90 65 - 99 mg/dL   BUN 10 7 - 25 mg/dL   Creat 9.03 9.39 - 8.73 mg/dL   eGFR 892 > OR = 60 fO/fpw/8.26f7   BUN/Creatinine Ratio SEE NOTE: 6 - 22 (calc)   Sodium 139 135 - 146 mmol/L   Potassium 4.4 3.5 - 5.3 mmol/L   Chloride 101 98 - 110 mmol/L   CO2 31 20 - 32 mmol/L   Calcium 9.8 8.6 - 10.3 mg/dL   Total Protein 7.0 6.1 - 8.1 g/dL   Albumin 4.7 3.6 - 5.1 g/dL   Globulin 2.3 1.9 - 3.7 g/dL (calc)   AG Ratio 2.0 1.0 - 2.5 (calc)   Total Bilirubin 0.9 0.2 - 1.2 mg/dL   Alkaline phosphatase (APISO) 68 36 - 130 U/L   AST 20 10 - 40 U/L   ALT 21 9 - 46 U/L  Hemoglobin A1c   Collection Time: 07/21/24 10:24 AM  Result Value Ref Range   Hgb A1c MFr Bld 5.6 <5.7 %   Mean Plasma Glucose 114 mg/dL   eAG (mmol/L) 6.3 mmol/L  Lipid panel   Collection Time: 07/21/24 10:24 AM  Result Value Ref Range   Cholesterol 198 <200 mg/dL   HDL 70 > OR = 40 mg/dL   Triglycerides 53 <849 mg/dL   LDL Cholesterol (Calc) 114 (H) mg/dL (calc)   Total CHOL/HDL Ratio 2.8 <5.0 (calc)   Non-HDL Cholesterol (Calc) 128 <130 mg/dL (calc)  HIV Antibody (routine testing w rflx)   Collection Time: 07/21/24 10:24 AM  Result Value Ref Range   HIV FINAL INTERPRETATION HIV NEGATIVE    HIV 1&2 Ab, 4th Generation NON-REACTIVE NON-REACTIVE  Hepatitis C antibody   Collection Time: 07/21/24 10:24 AM  Result Value Ref Range   Hepatitis C Ab NON-REACTIVE NON-REACTIVE  RPR W/RFLX TO RPR TITER, TREPONEMAL AB, SCREEN AND DIAGNOSIS   Collection Time: 07/21/24 10:24 AM  Result Value Ref Range   RPR Ser Ql NON-REACTIVE NON-REACTIVE          Assessment & Plan:   Problem List Items Addressed This Visit   None Visit Diagnoses       COVID-19    -  Primary   Relevant Medications   nirmatrelvir /ritonavir  (PAXLOVID ) 20 x 150 MG & 10 x 100MG  TABS    Other Relevant Orders   POC  Covid19/Flu A&B Antigen        Assessment and Plan Assessment & Plan COVID-19 infection Positive COVID-19 test with symptoms including fever, sinus congestion, and drainage. Symptoms initially improved but recurred. Discussed Paxlovid  to shorten symptom duration and severity, with side effects including metallic taste and diarrhea. He is contagious for five days from symptom onset. - Prescribed Paxlovid  - Advised symptomatic treatment with Flonase , Mucinex , and allergy medication - Encouraged increased fluid intake - Advised isolation for five days from symptom onset - Discussed potential side effects of Paxlovid , including metallic taste and diarrhea - Advised wife to take vitamin C, vitamin D, and zinc to support immune system - Encouraged wife to increase fluid intake        Follow up plan: Return if symptoms worsen or fail to improve. "

## 2024-09-08 NOTE — Patient Instructions (Signed)
 Recommend taking zyrtec, flonase, mucinex, vitamin d, vitamin c, and zinc. Push fluids and get rest.
